# Patient Record
Sex: Female | Born: 2000 | Race: Black or African American | Hispanic: No | Marital: Single | Smoking: Never smoker
Health system: Southern US, Community
[De-identification: ages and names within clinical notes are randomized; demographics above are authoritative.]

## PROBLEM LIST (undated history)

## (undated) DIAGNOSIS — R519 Headache, unspecified: Secondary | ICD-10-CM

## (undated) DIAGNOSIS — F329 Major depressive disorder, single episode, unspecified: Secondary | ICD-10-CM

## (undated) DIAGNOSIS — F411 Generalized anxiety disorder: Secondary | ICD-10-CM

## (undated) DIAGNOSIS — F909 Attention-deficit hyperactivity disorder, unspecified type: Secondary | ICD-10-CM

## (undated) DIAGNOSIS — R51 Headache: Secondary | ICD-10-CM

## (undated) HISTORY — DX: Headache, unspecified: R51.9

## (undated) HISTORY — DX: Headache: R51

## (undated) HISTORY — PX: NO PAST SURGERIES: SHX2092

## (undated) HISTORY — DX: Major depressive disorder, single episode, unspecified: F32.9

## (undated) HISTORY — DX: Generalized anxiety disorder: F41.1

---
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
## 2022-01-19 ENCOUNTER — Encounter (HOSPITAL_COMMUNITY): Payer: Self-pay

## 2022-01-19 ENCOUNTER — Telehealth (HOSPITAL_COMMUNITY): Payer: BLUE CROSS/BLUE SHIELD | Admitting: Psychiatry

## 2022-01-22 ENCOUNTER — Telehealth (HOSPITAL_COMMUNITY): Payer: Self-pay | Admitting: Psychiatry

## 2022-04-26 ENCOUNTER — Other Ambulatory Visit (HOSPITAL_COMMUNITY): Payer: Self-pay | Admitting: Psychiatry

## 2022-04-26 DIAGNOSIS — F331 Major depressive disorder, recurrent, moderate: Secondary | ICD-10-CM

## 2022-04-26 DIAGNOSIS — F411 Generalized anxiety disorder: Secondary | ICD-10-CM

## 2022-05-04 ENCOUNTER — Other Ambulatory Visit (HOSPITAL_COMMUNITY): Payer: Self-pay | Admitting: Psychiatry

## 2022-05-04 DIAGNOSIS — F331 Major depressive disorder, recurrent, moderate: Secondary | ICD-10-CM

## 2022-05-04 DIAGNOSIS — F411 Generalized anxiety disorder: Secondary | ICD-10-CM

## 2022-05-25 ENCOUNTER — Other Ambulatory Visit (HOSPITAL_COMMUNITY): Payer: Self-pay | Admitting: Psychiatry

## 2022-05-25 DIAGNOSIS — F331 Major depressive disorder, recurrent, moderate: Secondary | ICD-10-CM

## 2022-05-25 DIAGNOSIS — F411 Generalized anxiety disorder: Secondary | ICD-10-CM

## 2022-05-25 NOTE — Telephone Encounter (Signed)
Received request for refill of patient's Remeron.  Patient last received a refill on 7/11.  This was sent in.   Sent: -Remeron 15 mg QHS.  30 tablets with 0 refills.    Arna Snipe MD Resident

## 2022-06-23 ENCOUNTER — Encounter (HOSPITAL_COMMUNITY): Payer: Self-pay

## 2022-06-23 ENCOUNTER — Encounter (HOSPITAL_COMMUNITY): Payer: Self-pay | Admitting: Psychiatry

## 2022-06-23 ENCOUNTER — Telehealth (INDEPENDENT_AMBULATORY_CARE_PROVIDER_SITE_OTHER): Payer: No Payment, Other | Admitting: Psychiatry

## 2022-06-23 DIAGNOSIS — F411 Generalized anxiety disorder: Secondary | ICD-10-CM | POA: Diagnosis not present

## 2022-06-23 DIAGNOSIS — F331 Major depressive disorder, recurrent, moderate: Secondary | ICD-10-CM

## 2022-06-23 DIAGNOSIS — F900 Attention-deficit hyperactivity disorder, predominantly inattentive type: Secondary | ICD-10-CM | POA: Insufficient documentation

## 2022-06-23 MED ORDER — MIRTAZAPINE 15 MG PO TABS: 15.0000 mg | ORAL_TABLET | Freq: Every day | ORAL | 0 refills | Status: DC

## 2022-06-23 MED ORDER — MIRTAZAPINE 15 MG PO TABS: 15.0000 mg | ORAL_TABLET | Freq: Every day | ORAL | 3 refills | Status: DC

## 2022-06-23 MED ORDER — VILOXAZINE HCL ER 200 MG PO CP24: 200.0000 mg | ORAL_CAPSULE | Freq: Every day | ORAL | 3 refills | Status: DC

## 2022-06-23 NOTE — Progress Notes (Signed)
BH MD/PA/NP OP Progress Note Virtual Visit via Telephone Note  I connected with Shannon Jimenez on 06/23/22 at  3:00 PM EDT by telephone and verified that I am speaking with the correct person using two identifiers.  Location: Patient: home Provider: Clinic   I discussed the limitations, risks, security and privacy concerns of performing an evaluation and management service by telephone and the availability of in person appointments. I also discussed with the patient that there may be a patient responsible charge related to this service. The patient expressed understanding and agreed to proceed.   I provided 30 minutes of non-face-to-face time during this encounter.   06/23/2022 2:57 PM Shannon Jimenez  MRN:  [id number]  Chief Complaint: "I have some issues concentrating"    HPI: 21 year old female seen today for follow up psychiatric evaluation. She has a psychiatric history of anxiety and depression.  He is currently managed on mirtazapine 15 mg nightly.  She notes her medication is somewhat effective in managing her psychiatric conditions.  Today she was unable to login virtually so assessment is done over the phone.  During exam she was pleasant, cooperative, and engaged in conversation.  She informed Clinical research associate that she loves her medication but notes that she has issues concentrating.  She endorses symptoms of ADHD such as distractibility, poor listening skills, inattentiveness to mentally taxing task, and forgetfulness.  Patient reports that she found Strattera ineffective in the past.  She has also tried Wellbutrin without success.  Provider discussed Leavy Cella with patient.  She informed Clinical research associate that she would like to try it.    Patient informed writer that her anxiety and depression are well managed.  Today provider conducted a GAD-7 and patient scored an 8.  Provider also conducted PHQ-9 and patient scored a 7.  She endorses adequate sleep and appetite today she denies SI/HI/VAH,  mania, paranoia.  Patient informed Clinical research associate that she ran out of her medications 3 weeks ago and experienced hallucinations while off.  Since restarting she notes that she no longer has hallucinations.    Today patient agreeable to starting Qulbree 200 mg daily to help manage symptoms of ADHD. Potential side effects of medication and risks vs benefits of treatment vs non-treatment were explained and discussed. All questions were answered.  She will continue mirtazapine as prescribed.  Visit Diagnosis:    ICD-10-CM   1. Attention deficit hyperactivity disorder (ADHD), predominantly inattentive type  F90.0 viloxazine ER (QELBREE) 200 MG 24 hr capsule    2. Generalized anxiety disorder  F41.1 mirtazapine (REMERON) 15 MG tablet    3. Major depressive disorder, recurrent episode, moderate (HCC)  F33.1 mirtazapine (REMERON) 15 MG tablet      Past Psychiatric History: anxiety and depression  Past Medical History:  Past Medical History:  Diagnosis Date   ADHD (attention deficit hyperactivity disorder)    Headache     Past Surgical History:  Procedure Laterality Date   NO PAST SURGERIES      Family Psychiatric History: Maternal Aunt bipolar disorder, depression, and anxiety, maternal mother depression, and anxiety, mother depression and anxiety, sister depression and anxiety   Family History:  Family History  Problem Relation Age of Onset   Other Paternal Grandfather        Died at the age of 37 due to Flu/H1N1   Migraines Mother    ADD / ADHD Mother    Anxiety disorder Mother    Depression Mother    Anxiety disorder Maternal Grandmother  Depression Maternal Grandmother    Migraines Paternal Grandmother    Migraines Maternal Aunt    Seizures Neg Hx    Autism Neg Hx    Bipolar disorder Neg Hx    Schizophrenia Neg Hx     Social History:  Social History   Socioeconomic History   Marital status: Single    Spouse name: Not on file   Number of children: Not on file   Years of  education: Not on file   Highest education level: Not on file  Occupational History   Not on file  Tobacco Use   Smoking status: Never   Smokeless tobacco: Never  Substance and Sexual Activity   Alcohol use: Not on file   Drug use: Not on file   Sexual activity: Not on file  Other Topics Concern   Not on file  Social History Narrative   Lives with mom, maternal grandparents, three siblings. She is in the 10th grade at [employer].    Social Determinants of Health   Financial Resource Strain: Not on file  Food Insecurity: Not on file  Transportation Needs: Not on file  Physical Activity: Not on file  Stress: Not on file  Social Connections: Not on file    Allergies:  Allergies  Allergen Reactions   Focalin [Dexmethylphenidate Hcl]     Metabolic Disorder Labs: No results found for: "HGBA1C", "MPG" No results found for: "PROLACTIN" No results found for: "CHOL", "TRIG", "HDL", "CHOLHDL", "VLDL", "LDLCALC" No results found for: "TSH"  Therapeutic Level Labs: No results found for: "LITHIUM" No results found for: "VALPROATE" No results found for: "CBMZ"  Current Medications: Current Outpatient Medications  Medication Sig Dispense Refill   viloxazine ER (QELBREE) 200 MG 24 hr capsule Take 1 capsule (200 mg total) by mouth daily. 30 capsule 3   ibuprofen (ADVIL,MOTRIN) 100 MG/5ML suspension Take 100 mg by mouth every 6 (six) hours as needed for pain or fever.     Magnesium Oxide 500 MG TABS Take 1 tablet (500 mg total) by mouth daily.  0   mirtazapine (REMERON) 15 MG tablet Take 1 tablet (15 mg total) by mouth at bedtime. 30 tablet 0   riboflavin (VITAMIN B-2) 100 MG TABS tablet Take 1 tablet (100 mg total) by mouth daily.  0   No current facility-administered medications for this visit.     Musculoskeletal: Strength & Muscle Tone:  Unable to assess due to telephone visit Gait & Station:  Unable to assess due to telephone visit Patient leans:  N/A  Psychiatric Specialty Exam: Review of Systems  There were no vitals taken for this visit.There is no height or weight on file to calculate BMI.  General Appearance:  Unable to assess due to telephone visit  Eye Contact:   Unable to assess due to telephone visit  Speech:  Clear and Coherent and Normal Rate  Volume:  Normal  Mood:  Anxious and Depressed  Affect:  Appropriate and Congruent  Thought Process:  Coherent, Goal Directed and Linear  Orientation:  Full (Time, Place, and Person)  Thought Content: WDL and Logical   Suicidal Thoughts:  No  Homicidal Thoughts:  No  Memory:  Immediate;   Good Recent;   Good Remote;   Good  Judgement:  Good  Insight:  Good  Psychomotor Activity:   Unable to assess due to telephone visit  Concentration:  Concentration: Good and Attention Span: Good  Recall:  Good  Fund of Knowledge: Good  Language: Good  Akathisia:   Able to assess due to telephone visit  Handed:  Right  AIMS (if indicated): Not done  Assets:  Communication Skills Desire for Improvement Financial Resources/Insurance Housing Leisure Time Physical Health Social Support  ADL's:  Intact  Cognition: WNL  Sleep:  Good   Screenings: GAD-7    Flowsheet Row Video Visit from 06/23/2022 in [facility] Video Visit from 11/12/2021 in [facility] Clinical Support from 08/18/2021 in [facility] Clinical Support from 05/12/2021 in [facility] Clinical Support from 02/09/2021 in [facility]  Total GAD-7 Score 8 10 9 13 7       PHQ2-9    Flowsheet Row Video Visit from 06/23/2022 in [facility] Video Visit from 11/12/2021 in [facility] Clinical Support from 08/18/2021 in [facility] Clinical Support from 05/12/2021 in [facility]  Clinical Support from 02/09/2021 in [facility]  PHQ-2 Total Score 1 3 1 1 6   PHQ-9 Total Score 8 16 10 11 14       Flowsheet Row Video Visit from 11/12/2021 in [facility] Clinical Support from 08/18/2021 in [facility] Clinical Support from 02/09/2021 in [facility]  C-SSRS RISK CATEGORY Error: Q7 should not be populated when Q6 is No Error: Q7 should not be populated when Q6 is No No Risk        Assessment and Plan: Patient informed writer that her anxiety and depression are well managed however notes that she is having issues with concentration.  Today she is agreeable to start equilibrate 200 mg to help manage symptoms of ADHD.  She will continue mirtazapine as prescribed.  1. Generalized anxiety disorder  Continue- mirtazapine (REMERON) 15 MG tablet; Take 1 tablet (15 mg total) by mouth at bedtime.  Dispense: 30 tablet; Refill: 3  2. Major depressive disorder, recurrent episode, moderate (HCC)  Continue- mirtazapine (REMERON) 15 MG tablet; Take 1 tablet (15 mg total) by mouth at bedtime.  Dispense: 30 tablet; Refill: 3  3. Attention deficit hyperactivity disorder (ADHD), predominantly inattentive type  Start- viloxazine ER (QELBREE) 200 MG 24 hr capsule; Take 1 capsule (200 mg total) by mouth daily.  Dispense: 30 capsule; Refill: 3    Follow-up in 3 months Follow-up with therapy   [facility], NP 06/23/2022, 2:57 PM

## 2022-07-02 ENCOUNTER — Telehealth (HOSPITAL_COMMUNITY): Payer: Self-pay | Admitting: *Deleted

## 2022-07-02 NOTE — Telephone Encounter (Signed)
Request on Covermymeds for her to have a PA for Sm[place]. Reviewed her chart and I dont see any insurance listed. Without insurance no need to do a PA. Pharmacy not open at this time, will call them later today to see if they have insurance on her I am not aware of and if so will submit an appropriate PA>

## 2022-09-15 ENCOUNTER — Telehealth (HOSPITAL_COMMUNITY): Payer: Self-pay | Admitting: *Deleted

## 2022-09-15 ENCOUNTER — Other Ambulatory Visit (HOSPITAL_COMMUNITY): Payer: Self-pay | Admitting: Psychiatry

## 2022-09-15 DIAGNOSIS — F900 Attention-deficit hyperactivity disorder, predominantly inattentive type: Secondary | ICD-10-CM

## 2022-09-15 MED ORDER — VILOXAZINE HCL ER 200 MG PO CP24: 200.0000 mg | ORAL_CAPSULE | Freq: Every day | ORAL | 3 refills | Status: DC

## 2022-09-15 NOTE — Telephone Encounter (Signed)
Rx [facility] NEED FOR CLARIFICATION  QELBREE ER 200 NG CAP QTY: 30 SIG: TAKE 1 CAPSULE BY MOTH ONCE DAILY

## 2022-09-15 NOTE — Telephone Encounter (Signed)
Medication refilled and sent to preferred pharmacy

## 2022-09-21 ENCOUNTER — Telehealth (HOSPITAL_COMMUNITY): Payer: Self-pay

## 2022-09-23 ENCOUNTER — Telehealth (INDEPENDENT_AMBULATORY_CARE_PROVIDER_SITE_OTHER): Payer: No Payment, Other | Admitting: Psychiatry

## 2022-09-23 DIAGNOSIS — F411 Generalized anxiety disorder: Secondary | ICD-10-CM | POA: Diagnosis not present

## 2022-09-23 DIAGNOSIS — F900 Attention-deficit hyperactivity disorder, predominantly inattentive type: Secondary | ICD-10-CM

## 2022-09-23 DIAGNOSIS — F331 Major depressive disorder, recurrent, moderate: Secondary | ICD-10-CM | POA: Diagnosis not present

## 2022-09-23 MED ORDER — VILOXAZINE HCL ER 200 MG PO CP24: 200.0000 mg | ORAL_CAPSULE | Freq: Every day | ORAL | 3 refills | Status: DC

## 2022-09-23 MED ORDER — MIRTAZAPINE 15 MG PO TABS: 15.0000 mg | ORAL_TABLET | Freq: Every day | ORAL | 3 refills | Status: DC

## 2022-09-23 NOTE — Progress Notes (Signed)
BH MD/PA/NP OP Progress Note Virtual Visit via Video Note  I connected with Shannon Jimenez on 09/24/22 at  2:30 PM EST by a video enabled telemedicine application and verified that I am speaking with the correct person using two identifiers.  Location: Patient: Home Provider: Clinic   I discussed the limitations of evaluation and management by telemedicine and the availability of in person appointments. The patient expressed understanding and agreed to proceed.  I provided 30 minutes of non-face-to-face time during this encounter.     09/24/2022 7:45 AM Shannon Jimenez  MRN:  [id number]  Chief Complaint: "I am not good at saying how I feel"    HPI: 21 year old female seen today for follow up psychiatric evaluation. She has a psychiatric history of anxiety and depression.  He is currently managed on mirtazapine 15 mg nightly and Qulebree 200 mg daily.  She notes her medication are effective in managing her psychiatric conditions.  Today she was well groomed, pleasant, cooperative, engaged in conversation, and maintained eye contact.  She informed Probation officer that she is not good at saying what she feels.  She informed Probation officer that at time she is unsure if she is anxious or depressed.  She notes that she identifies her depressive states by her energy level.  She notes that when she is unable to get out of bed or attend to her ADLs she is depressed.  Today provider conducted PHQ-9 and patient scored a 14, at her last visit she scored an 8, provide also conducted a GAD-7 and patient scored an 8, at her last visit she scored an 8.  She reports that she is worried about finding a job, decision she makes, and her relationships.  Today she endorses passive SI but denies wanting to harm herself.  She denies SI/HI/AVH, mania, paranoia.   Patient informed Probation officer that she has been without Deboraha Sprang for a few weeks and continues to struggle with symptoms of ADHD such as distractibility, poor  inattentiveness to mentally taxing task.  Today she is agreeable to restarting Qelbree 200 mg daily.  She will continue mirtazapine as prescribed.  No other concerns noted at this time.      Visit Diagnosis:    ICD-10-CM   1. Major depressive disorder, recurrent episode, moderate (HCC)  F33.1 mirtazapine (REMERON) 15 MG tablet    2. Generalized anxiety disorder  F41.1 mirtazapine (REMERON) 15 MG tablet    3. Attention deficit hyperactivity disorder (ADHD), predominantly inattentive type  F90.0 viloxazine ER (QELBREE) 200 MG 24 hr capsule      Past Psychiatric History: anxiety and depression  Past Medical History:  Past Medical History:  Diagnosis Date   ADHD (attention deficit hyperactivity disorder)    Headache     Past Surgical History:  Procedure Laterality Date   NO PAST SURGERIES      Family Psychiatric History: Maternal Aunt bipolar disorder, depression, and anxiety, maternal mother depression, and anxiety, mother depression and anxiety, sister depression and anxiety   Family History:  Family History  Problem Relation Age of Onset   Other Paternal Grandfather        Died at the age of 55 due to Flu/H1N1   Migraines Mother    ADD / ADHD Mother    Anxiety disorder Mother    Depression Mother    Anxiety disorder Maternal Grandmother    Depression Maternal Grandmother    Migraines Paternal Grandmother    Migraines Maternal Aunt    Seizures Neg  Hx    Autism Neg Hx    Bipolar disorder Neg Hx    Schizophrenia Neg Hx     Social History:  Social History   Socioeconomic History   Marital status: Single    Spouse name: Not on file   Number of children: Not on file   Years of education: Not on file   Highest education level: Not on file  Occupational History   Not on file  Tobacco Use   Smoking status: Never   Smokeless tobacco: Never  Substance and Sexual Activity   Alcohol use: Not on file   Drug use: Not on file   Sexual activity: Not on file  Other  Topics Concern   Not on file  Social History Narrative   Lives with mom, maternal grandparents, three siblings. She is in the 10th grade at [employer].    Social Determinants of Health   Financial Resource Strain: Not on file  Food Insecurity: Not on file  Transportation Needs: Not on file  Physical Activity: Not on file  Stress: Not on file  Social Connections: Not on file    Allergies:  Allergies  Allergen Reactions   Focalin [Dexmethylphenidate Hcl]     Metabolic Disorder Labs: No results found for: "HGBA1C", "MPG" No results found for: "PROLACTIN" No results found for: "CHOL", "TRIG", "HDL", "CHOLHDL", "VLDL", "LDLCALC" No results found for: "TSH"  Therapeutic Level Labs: No results found for: "LITHIUM" No results found for: "VALPROATE" No results found for: "CBMZ"  Current Medications: Current Outpatient Medications  Medication Sig Dispense Refill   ibuprofen (ADVIL,MOTRIN) 100 MG/5ML suspension Take 100 mg by mouth every 6 (six) hours as needed for pain or fever.     Magnesium Oxide 500 MG TABS Take 1 tablet (500 mg total) by mouth daily.  0   mirtazapine (REMERON) 15 MG tablet Take 1 tablet (15 mg total) by mouth at bedtime. 30 tablet 3   riboflavin (VITAMIN B-2) 100 MG TABS tablet Take 1 tablet (100 mg total) by mouth daily.  0   viloxazine ER (QELBREE) 200 MG 24 hr capsule Take 1 capsule (200 mg total) by mouth daily. 30 capsule 3   No current facility-administered medications for this visit.     Musculoskeletal: Strength & Muscle Tone: within normal limits and  telehealth visit Gait & Station: normal, telehealth visit Patient leans: N/A  Psychiatric Specialty Exam: Review of Systems  There were no vitals taken for this visit.There is no height or weight on file to calculate BMI.  General Appearance: Well Groomed  Eye Contact:  Good  Speech:  Clear and Coherent and Normal Rate  Volume:  Normal  Mood:  Depressed and reports able to cope   Affect:  Appropriate and Congruent  Thought Process:  Coherent, Goal Directed and Linear  Orientation:  Full (Time, Place, and Person)  Thought Content: WDL and Logical   Suicidal Thoughts:  Yes.  without intent/plan  Homicidal Thoughts:  No  Memory:  Immediate;   Good Recent;   Good Remote;   Good  Judgement:  Good  Insight:  Good  Psychomotor Activity:  Normal  Concentration:  Concentration: Good and Attention Span: Good  Recall:  Good  Fund of Knowledge: Good  Language: Good  Akathisia:  No  Handed:  Right  AIMS (if indicated): Not done  Assets:  Communication Skills Desire for Improvement Financial Resources/Insurance Housing Leisure Time Physical Health Social Support  ADL's:  Intact  Cognition: WNL  Sleep:  Good   Screenings: GAD-7    Flowsheet Row Video Visit from 09/23/2022 in [facility] Video Visit from 06/23/2022 in [facility] Video Visit from 11/12/2021 in [place] from 08/18/2021 in [place] from 05/12/2021 in [facility]  Total GAD-7 Score 8 8 10 9 13       PHQ2-9    Flowsheet Row Video Visit from 09/23/2022 in [facility] Video Visit from 06/23/2022 in [facility] Video Visit from 11/12/2021 in [place] from 08/18/2021 in [place] from 05/12/2021 in [place]  PHQ-2 Total Score 3 1 3 1 1   PHQ-9 Total Score 14 8 16 10 11       Flowsheet Row Video Visit from 09/23/2022 in [facility] Video Visit from 11/12/2021 in [place] from 08/18/2021 in [place] Error: Q7 should  not be populated when Q6 is No Error: Q7 should not be populated when Q6 is No Error: Q7 should not be populated when Q6 is No        Assessment and Plan: Patient endorses mild anxiety and depression but reports she is able to cope with it.  Patient informed Probation officer that she has been without Deboraha Sprang for a few weeks and continues to struggle with symptoms of ADHD such as distractibility, poor inattentiveness to mentally taxing task.  Today she is agreeable to restarting Qelbree 200 mg daily.  She will continue mirtazapine as prescribed.  1. Generalized anxiety disorder  Continue- mirtazapine (REMERON) 15 MG tablet; Take 1 tablet (15 mg total) by mouth at bedtime.  Dispense: 30 tablet; Refill: 3  2. Major depressive disorder, recurrent episode, moderate (HCC)  Continue- mirtazapine (REMERON) 15 MG tablet; Take 1 tablet (15 mg total) by mouth at bedtime.  Dispense: 30 tablet; Refill: 3  3. Attention deficit hyperactivity disorder (ADHD), predominantly inattentive type  Retart- viloxazine ER (QELBREE) 200 MG 24 hr capsule; Take 1 capsule (200 mg total) by mouth daily.  Dispense: 30 capsule; Refill: 3    Follow-up in 3 months Follow-up with therapy   Salley Slaughter, NP 09/24/2022, 7:45 AM

## 2022-09-24 ENCOUNTER — Encounter (HOSPITAL_COMMUNITY): Payer: Self-pay | Admitting: Psychiatry

## 2022-12-15 ENCOUNTER — Encounter (HOSPITAL_COMMUNITY): Payer: Self-pay | Admitting: Psychiatry

## 2022-12-15 ENCOUNTER — Telehealth (INDEPENDENT_AMBULATORY_CARE_PROVIDER_SITE_OTHER): Payer: No Payment, Other | Admitting: Psychiatry

## 2022-12-15 DIAGNOSIS — F900 Attention-deficit hyperactivity disorder, predominantly inattentive type: Secondary | ICD-10-CM

## 2022-12-15 DIAGNOSIS — F411 Generalized anxiety disorder: Secondary | ICD-10-CM | POA: Diagnosis not present

## 2022-12-15 DIAGNOSIS — F331 Major depressive disorder, recurrent, moderate: Secondary | ICD-10-CM | POA: Diagnosis not present

## 2022-12-15 MED ORDER — VILOXAZINE HCL ER 200 MG PO CP24: 200.0000 mg | ORAL_CAPSULE | Freq: Every day | ORAL | 3 refills | Status: DC

## 2022-12-15 MED ORDER — MIRTAZAPINE 7.5 MG PO TABS: 7.5000 mg | ORAL_TABLET | Freq: Every day | ORAL | 3 refills | Status: DC

## 2022-12-15 NOTE — Progress Notes (Signed)
BH MD/PA/NP OP Progress Note Virtual Visit via Video Note  I connected with Shannon Jimenez on 12/15/22 at  3:30 PM EST by a video enabled telemedicine application and verified that I am speaking with the correct person using two identifiers.  Location: Patient: Home Provider: Clinic   I discussed the limitations of evaluation and management by telemedicine and the availability of in person appointments. The patient expressed understanding and agreed to proceed.  I provided 30 minutes of non-face-to-face time during this encounter.     12/15/2022 3:50 PM Shannon Jimenez  MRN:  [id number]  Chief Complaint: "I feel like the mirtazapine may be to high"    HPI: 22 year old female seen today for follow up psychiatric evaluation. She has a psychiatric history of anxiety and depression.  She is currently managed on mirtazapine 15 mg nightly and Qulebree 200 mg daily.  She notes her medication are somewhat effective in managing her psychiatric conditions.  Today she was well groomed, pleasant, cooperative, engaged in conversation, and maintained eye contact.  She informed Probation officer that she and some of her family members feel like mirtazapine is too high.  She notes that she is tired most of the day.  She does Artist that she feels that her anxiety and depression are well-managed but notes that she has other life stressors that exacerbate her anxiety and depression.  Patient informed Probation officer that recently she and a friend expressed their feelings for one another.  She notes that she fears being in love and being vulnerable with someone.  She also notes that she regrets not being there for past friend who passed away last 2023-06-13 in the [place].  She informed Probation officer that she wished that she could have done things differently to show him that she cared.  Provider asked patient if she was interested in counseling.  She informed Probation officer that currently she has a female Social worker but does not feel  that he pushes her to be the best version of herself.  She informed Probation officer that she will be looking into switching providers in the near future.  Today provider conducted GAD-7 and patient scored a 13, at her last visit she scored an 8.  Provider also conducted PHQ-9 patient scored a 15, at her last visit she scored a 14.  She endorses decreased appetite noting that she only eats 1000 cal daily.  She denies binge/purging behaviors.  She endorses adequate sleep.  Today she denies SI/HI/VAH mania or paranoia.    Today mirtazapine 15 mg reduced to 7.5 mg nightly to decreased to daytime fatigue and manage anxiety and depression.  Provider informed patient that her decreased calorie intake may contribute to her fatigue.  She endorsed understanding.  Provider recommended BuSpar, or Wellbutrin at next visit if depression or anxiety worsens.  Patient notes that she has tried these medications in the past and may consider restarting it in the future.  At this time she will continue [place] as prescribed noting that she finds it effective in managing her ADHD.  No other concerns at this time.      Visit Diagnosis:    ICD-10-CM   1. Major depressive disorder, recurrent episode, moderate (HCC)  F33.1 mirtazapine (REMERON) 7.5 MG tablet    2. Generalized anxiety disorder  F41.1 mirtazapine (REMERON) 7.5 MG tablet    3. Attention deficit hyperactivity disorder (ADHD), predominantly inattentive type  F90.0 viloxazine ER (QELBREE) 200 MG 24 hr capsule      Past Psychiatric  History: anxiety and depression  Past Medical History:  Past Medical History:  Diagnosis Date   ADHD (attention deficit hyperactivity disorder)    Headache     Past Surgical History:  Procedure Laterality Date   NO PAST SURGERIES      Family Psychiatric History: Maternal Aunt bipolar disorder, depression, and anxiety, maternal mother depression, and anxiety, mother depression and anxiety, sister depression and anxiety   Family  History:  Family History  Problem Relation Age of Onset   Other Paternal Grandfather        Died at the age of 67 due to Flu/H1N1   Migraines Mother    ADD / ADHD Mother    Anxiety disorder Mother    Depression Mother    Anxiety disorder Maternal Grandmother    Depression Maternal Grandmother    Migraines Paternal Grandmother    Migraines Maternal Aunt    Seizures Neg Hx    Autism Neg Hx    Bipolar disorder Neg Hx    Schizophrenia Neg Hx     Social History:  Social History   Socioeconomic History   Marital status: Single    Spouse name: Not on file   Number of children: Not on file   Years of education: Not on file   Highest education level: Not on file  Occupational History   Not on file  Tobacco Use   Smoking status: Never   Smokeless tobacco: Never  Substance and Sexual Activity   Alcohol use: Not on file   Drug use: Not on file   Sexual activity: Not on file  Other Topics Concern   Not on file  Social History Narrative   Lives with mom, maternal grandparents, three siblings. She is in the 10th grade at [employer].    Social Determinants of Health   Financial Resource Strain: Not on file  Food Insecurity: Not on file  Transportation Needs: Not on file  Physical Activity: Not on file  Stress: Not on file  Social Connections: Not on file    Allergies:  Allergies  Allergen Reactions   Focalin [Dexmethylphenidate Hcl]     Metabolic Disorder Labs: No results found for: "HGBA1C", "MPG" No results found for: "PROLACTIN" No results found for: "CHOL", "TRIG", "HDL", "CHOLHDL", "VLDL", "LDLCALC" No results found for: "TSH"  Therapeutic Level Labs: No results found for: "LITHIUM" No results found for: "VALPROATE" No results found for: "CBMZ"  Current Medications: Current Outpatient Medications  Medication Sig Dispense Refill   ibuprofen (ADVIL,MOTRIN) 100 MG/5ML suspension Take 100 mg by mouth every 6 (six) hours as needed for pain or fever.      Magnesium Oxide 500 MG TABS Take 1 tablet (500 mg total) by mouth daily.  0   mirtazapine (REMERON) 7.5 MG tablet Take 1 tablet (7.5 mg total) by mouth at bedtime. 30 tablet 3   riboflavin (VITAMIN B-2) 100 MG TABS tablet Take 1 tablet (100 mg total) by mouth daily.  0   viloxazine ER (QELBREE) 200 MG 24 hr capsule Take 1 capsule (200 mg total) by mouth daily. 30 capsule 3   No current facility-administered medications for this visit.     Musculoskeletal: Strength & Muscle Tone: within normal limits and  telehealth visit Gait & Station: normal, telehealth visit Patient leans: N/A  Psychiatric Specialty Exam: Review of Systems  There were no vitals taken for this visit.There is no height or weight on file to calculate BMI.  General Appearance: Well Groomed  Eye Contact:  Good  Speech:  Clear and Coherent and Normal Rate  Volume:  Normal  Mood:  Euthymic and situational anxiety and depression but can cope  Affect:  Appropriate and Congruent  Thought Process:  Coherent, Goal Directed and Linear  Orientation:  Full (Time, Place, and Person)  Thought Content: WDL and Logical   Suicidal Thoughts:  No  Homicidal Thoughts:  No  Memory:  Immediate;   Good Recent;   Good Remote;   Good  Judgement:  Good  Insight:  Good  Psychomotor Activity:  Normal  Concentration:  Concentration: Good and Attention Span: Good  Recall:  Good  Fund of Knowledge: Good  Language: Good  Akathisia:  No  Handed:  Right  AIMS (if indicated): Not done  Assets:  Communication Skills Desire for Improvement Financial Resources/Insurance Housing Leisure Time Physical Health Social Support  ADL's:  Intact  Cognition: WNL  Sleep:  Good   Screenings: GAD-7    Flowsheet Row Video Visit from 12/15/2022 in [facility] Video Visit from 09/23/2022 in [facility] Video Visit from 06/23/2022 in [facility] Video Visit from  11/12/2021 in [place] from 08/18/2021 in [facility]  Total GAD-7 Score 13 8 8 10 9      $ PHQ2-9    Flowsheet Row Video Visit from 12/15/2022 in [facility] Video Visit from 09/23/2022 in [facility] Video Visit from 06/23/2022 in [facility] Video Visit from 11/12/2021 in [place] from 08/18/2021 in [place]  PHQ-2 Total Score 2 3 1 3 1  $ PHQ-9 Total Score 15 14 8 16 10      $ Flowsheet Row Video Visit from 09/23/2022 in [facility] Video Visit from 11/12/2021 in [place] from 08/18/2021 in [place] Error: Q7 should not be populated when Q6 is No Error: Q7 should not be populated when Q6 is No Error: Q7 should not be populated when Q6 is No        Assessment and Plan: Patient endorses situational anxiety and depression but reports that she is able to cope.  She does Artist that she would like mirtazapine reduced as it causes daytime fatigue.  Provider informed patient that low calorie intake may contribute to her fatigue.  She endorsed understanding.  Today mirtazapine reduce from 15 mg nightly to 7.5 mg.  She will continue all other medications as prescribed.  1. Major depressive disorder, recurrent episode, moderate (HCC)  Reduced- mirtazapine (REMERON) 7.5 MG tablet; Take 1 tablet (7.5 mg total) by mouth at bedtime.  Dispense: 30 tablet; Refill: 3  2. Generalized anxiety disorder  Reduced- mirtazapine (REMERON) 7.5 MG tablet; Take 1 tablet (7.5 mg total) by mouth at bedtime.  Dispense: 30 tablet; Refill: 3  3. Attention deficit hyperactivity disorder (ADHD), predominantly inattentive type  Continue- viloxazine  ER (QELBREE) 200 MG 24 hr capsule; Take 1 capsule (200 mg total) by mouth daily.  Dispense: 30 capsule; Refill: 3  Follow-up in 3 months Follow-up with therapy   Salley Slaughter, NP 12/15/2022, 3:50 PM

## 2023-03-03 ENCOUNTER — Encounter (HOSPITAL_COMMUNITY): Payer: Self-pay | Admitting: Psychiatry

## 2023-03-03 ENCOUNTER — Telehealth (HOSPITAL_COMMUNITY): Payer: No Payment, Other | Admitting: Psychiatry

## 2023-03-03 DIAGNOSIS — F900 Attention-deficit hyperactivity disorder, predominantly inattentive type: Secondary | ICD-10-CM | POA: Diagnosis not present

## 2023-03-03 DIAGNOSIS — F331 Major depressive disorder, recurrent, moderate: Secondary | ICD-10-CM

## 2023-03-03 DIAGNOSIS — F411 Generalized anxiety disorder: Secondary | ICD-10-CM

## 2023-03-03 MED ORDER — MIRTAZAPINE 7.5 MG PO TABS: 7.5000 mg | ORAL_TABLET | Freq: Every day | ORAL | 3 refills | Status: DC

## 2023-03-03 MED ORDER — VILOXAZINE HCL ER 200 MG PO CP24: 200.0000 mg | ORAL_CAPSULE | Freq: Every day | ORAL | 3 refills | Status: DC

## 2023-03-03 NOTE — Progress Notes (Signed)
BH MD/PA/NP OP Progress Note Virtual Visit via Video Note  I connected with Shannon Jimenez on 03/03/23 at  2:30 PM EDT by a video enabled telemedicine application and verified that I am speaking with the correct person using two identifiers.  Location: Patient: Home Provider: Clinic   I discussed the limitations of evaluation and management by telemedicine and the availability of in person appointments. The patient expressed understanding and agreed to proceed.  I provided 30 minutes of non-face-to-face time during this encounter.     03/03/2023 12:12 PM Shannon Jimenez  MRN:  [id number]  Chief Complaint: "My ADHD meds are working well"    HPI: 22 year old female seen today for follow up psychiatric evaluation. She has a psychiatric history of ADHD, anxiety, and depression.  She is currently managed on mirtazapine 7.5 mg nightly and Qulebree 200 mg daily.  She notes her medication are effective in managing her psychiatric conditions.  Today she was well groomed, pleasant, cooperative, engaged in conversation, and maintained eye contact.  She informed Clinical research associate that her ADHD medications work well. She describes her ADHD as a mountain with howling winds. With Leavy Cella she reports that she not longer has howling winds. She reports that she is able to focus and stay on task. Since her last visit she notes that her anxiety and depression has improved.  Today provider conducted GAD-7 and patient scored a 4, at her last visit she scored an 13.  Provider also conducted PHQ-9 patient scored a 13, at her last visit she scored a 15.  She endorses decreased appetite noting that she forgets to eat at times.  She denies binge/purging behaviors.  She endorses adequate sleep (6 hours).  Today she denies SI/HI/VAH mania or paranoia.    Patient informed writer that at times she has nightmares.  She denies it causing interference with her current life.  Patient requested that mirtazapine be increased to  help manage nightmares.  Provider informed patient that mirtazapine generally does not decrease nightmares and notes that when it was at a higher dose she was experiencing daytime fatigue.  She endorsed understanding and requested to stay at that current dose. No medication changes made today.  Patient agreeable to continue medications as prescribed.  No other concerns at this time        Visit Diagnosis:    ICD-10-CM   1. Major depressive disorder, recurrent episode, moderate (HCC)  F33.1 mirtazapine (REMERON) 7.5 MG tablet    2. Generalized anxiety disorder  F41.1 mirtazapine (REMERON) 7.5 MG tablet    3. Attention deficit hyperactivity disorder (ADHD), predominantly inattentive type  F90.0 viloxazine ER (QELBREE) 200 MG 24 hr capsule      Past Psychiatric History: anxiety and depression  Past Medical History:  Past Medical History:  Diagnosis Date   ADHD (attention deficit hyperactivity disorder)    Headache     Past Surgical History:  Procedure Laterality Date   NO PAST SURGERIES      Family Psychiatric History: Maternal Aunt bipolar disorder, depression, and anxiety, maternal mother depression, and anxiety, mother depression and anxiety, sister depression and anxiety   Family History:  Family History  Problem Relation Age of Onset   Other Paternal Grandfather        Died at the age of 63 due to Flu/H1N1   Migraines Mother    ADD / ADHD Mother    Anxiety disorder Mother    Depression Mother    Anxiety disorder Maternal Grandmother  Depression Maternal Grandmother    Migraines Paternal Grandmother    Migraines Maternal Aunt    Seizures Neg Hx    Autism Neg Hx    Bipolar disorder Neg Hx    Schizophrenia Neg Hx     Social History:  Social History   Socioeconomic History   Marital status: Single    Spouse name: Not on file   Number of children: Not on file   Years of education: Not on file   Highest education level: Not on file  Occupational History    Not on file  Tobacco Use   Smoking status: Never   Smokeless tobacco: Never  Substance and Sexual Activity   Alcohol use: Not on file   Drug use: Not on file   Sexual activity: Not on file  Other Topics Concern   Not on file  Social History Narrative   Lives with mom, maternal grandparents, three siblings. She is in the 10th grade at [employer].    Social Determinants of Health   Financial Resource Strain: Not on file  Food Insecurity: Not on file  Transportation Needs: Not on file  Physical Activity: Not on file  Stress: Not on file  Social Connections: Not on file    Allergies:  Allergies  Allergen Reactions   Focalin [Dexmethylphenidate Hcl]     Metabolic Disorder Labs: No results found for: "HGBA1C", "MPG" No results found for: "PROLACTIN" No results found for: "CHOL", "TRIG", "HDL", "CHOLHDL", "VLDL", "LDLCALC" No results found for: "TSH"  Therapeutic Level Labs: No results found for: "LITHIUM" No results found for: "VALPROATE" No results found for: "CBMZ"  Current Medications: Current Outpatient Medications  Medication Sig Dispense Refill   ibuprofen (ADVIL,MOTRIN) 100 MG/5ML suspension Take 100 mg by mouth every 6 (six) hours as needed for pain or fever.     Magnesium Oxide 500 MG TABS Take 1 tablet (500 mg total) by mouth daily.  0   mirtazapine (REMERON) 7.5 MG tablet Take 1 tablet (7.5 mg total) by mouth at bedtime. 30 tablet 3   riboflavin (VITAMIN B-2) 100 MG TABS tablet Take 1 tablet (100 mg total) by mouth daily.  0   viloxazine ER (QELBREE) 200 MG 24 hr capsule Take 1 capsule (200 mg total) by mouth daily. 30 capsule 3   No current facility-administered medications for this visit.     Musculoskeletal: Strength & Muscle Tone: within normal limits and  telehealth visit Gait & Station: normal, telehealth visit Patient leans: N/A  Psychiatric Specialty Exam: Review of Systems  There were no vitals taken for this visit.There is no height  or weight on file to calculate BMI.  General Appearance: Well Groomed  Eye Contact:  Good  Speech:  Clear and Coherent and Normal Rate  Volume:  Normal  Mood:  Euthymic  Affect:  Appropriate and Congruent  Thought Process:  Coherent, Goal Directed and Linear  Orientation:  Full (Time, Place, and Person)  Thought Content: WDL and Logical   Suicidal Thoughts:  No  Homicidal Thoughts:  No  Memory:  Immediate;   Good Recent;   Good Remote;   Good  Judgement:  Good  Insight:  Good  Psychomotor Activity:  Normal  Concentration:  Concentration: Good and Attention Span: Good  Recall:  Good  Fund of Knowledge: Good  Language: Good  Akathisia:  No  Handed:  Right  AIMS (if indicated): Not done  Assets:  Communication Skills Desire for Improvement Financial Resources/Insurance Housing Leisure Time Physical  Health Social Support  ADL's:  Intact  Cognition: WNL  Sleep:  Good   Screenings: GAD-7    Flowsheet Row Video Visit from 03/03/2023 in [facility] Video Visit from 12/15/2022 in [facility] Video Visit from 09/23/2022 in [facility] Video Visit from 06/23/2022 in [facility] Video Visit from 11/12/2021 in [facility]  Total GAD-7 Score 4 13 8 8 10       PHQ2-9    Flowsheet Row Video Visit from 03/03/2023 in [facility] Video Visit from 12/15/2022 in [facility] Video Visit from 09/23/2022 in [facility] Video Visit from 06/23/2022 in [facility] Video Visit from 11/12/2021 in [facility]  PHQ-2 Total Score 2 2 3 1 3   PHQ-9 Total Score 13 15 14 8 16       Flowsheet Row Video Visit from 09/23/2022 in [facility] Video Visit from 11/12/2021 in [facility] Clinical Support from 08/18/2021 in [facility]  C-SSRS RISK CATEGORY Error: Q7 should not be populated when Q6 is No Error: Q7 should not be populated when Q6 is No Error: Q7 should not be populated when Q6 is No        Assessment and Plan: Patient reports that her anxiety and depression has improved since her last visit.  She does note that she has nightmares but reports that it is not interfering with her current life. Patient requested that mirtazapine be increased to help manage nightmares.  Provider informed patient that mirtazapine generally does not decrease nightmares and notes that when it was at a higher dose she was experiencing daytime fatigue.  She endorsed understanding and requested to stay at that current dose. No medication changes made today.  Patient agreeable to continue medications as prescribed.   1. Major depressive disorder, recurrent episode, moderate (HCC)  Continue- mirtazapine (REMERON) 7.5 MG tablet; Take 1 tablet (7.5 mg total) by mouth at bedtime.  Dispense: 30 tablet; Refill: 3  2. Generalized anxiety disorder  Continue- mirtazapine (REMERON) 7.5 MG tablet; Take 1 tablet (7.5 mg total) by mouth at bedtime.  Dispense: 30 tablet; Refill: 3  3. Attention deficit hyperactivity disorder (ADHD), predominantly inattentive type  Continue- viloxazine ER (QELBREE) 200 MG 24 hr capsule; Take 1 capsule (200 mg total) by mouth daily.  Dispense: 30 capsule; Refill: 3  Follow-up in 2.5 months Follow-up with therapy   Shanna Cisco, NP 03/03/2023, 12:12 PM

## 2023-03-16 ENCOUNTER — Ambulatory Visit: Payer: No Typology Code available for payment source | Admitting: Family Medicine

## 2023-03-16 ENCOUNTER — Encounter: Payer: Self-pay | Admitting: Family Medicine

## 2023-03-16 ENCOUNTER — Telehealth: Payer: Self-pay | Admitting: Family Medicine

## 2023-03-16 VITALS — BP 108/80 | HR 87 | Temp 98.5°F | Ht 64.75 in | Wt 169.5 lb

## 2023-03-16 DIAGNOSIS — Z114 Encounter for screening for human immunodeficiency virus [HIV]: Secondary | ICD-10-CM

## 2023-03-16 DIAGNOSIS — Z1159 Encounter for screening for other viral diseases: Secondary | ICD-10-CM

## 2023-03-16 DIAGNOSIS — M25561 Pain in right knee: Secondary | ICD-10-CM | POA: Diagnosis not present

## 2023-03-16 DIAGNOSIS — H6122 Impacted cerumen, left ear: Secondary | ICD-10-CM | POA: Diagnosis not present

## 2023-03-16 DIAGNOSIS — N942 Vaginismus: Secondary | ICD-10-CM | POA: Diagnosis not present

## 2023-03-16 NOTE — Assessment & Plan Note (Signed)
S/p manual removal and also irrigation. Ear is clear of wax at this time. RTC PRN

## 2023-03-16 NOTE — Telephone Encounter (Signed)
Pt said she is interested in Hepatitis C and HIV testing. She would like the nurse to reach out to her with further information.

## 2023-03-16 NOTE — Telephone Encounter (Signed)
Ok! I can place the orders and she can return for a lab appointment to have the blood drawn.

## 2023-03-16 NOTE — Progress Notes (Signed)
New Patient Office Visit  Subjective    Patient ID: Shannon Jimenez, female    DOB: [birth year]  Age: 22 y.o. MRN: [id number]  CC:  Chief Complaint  Patient presents with   Establish Care    HPI Shannon Jimenez presents to establish care Patient is reporting for about 2-3 weeks she was having with a lot of knee pain on both sides. States that when she put pressure on her knees they would be very painful and pop, especially the right knee which was worse than the left. States that she dances for exercise and states she was doing a lot of exercise prior to the injury, states that he was limping and had difficulty straightening out. States that the symptom did resolve, she rested the knee and she used an ACE bandage. States that it will occasionally flare up when she does too much high impact exercise. Knee did not have any redness, was not hot to touch.   Pt reports that her mom wanted her to mention problems with her left ear. States that she gets clogging in the ear with wax. States that she tries to keep it clean, uses mineral oil but she cannot hear out of the left ear.   Pt had questions about vaginal discharge and vaginal pain with insertion of tampon. She reports no fever/chills, she is not sexually active, no vaginal itching or discomfort. She reports normal periods. No pelvic pain, no other associated findings. We  discussed what warning signs would constitute further investigation or referral to GYN. I offered referral due to the vaginal pain but patient states she will wait on this.   Current Outpatient Medications  Medication Instructions   ibuprofen (ADVIL) 100 mg, Oral, As needed   MELATONIN PO Oral, Daily at bedtime   mirtazapine (REMERON) 7.5 mg, Oral, Daily at bedtime   viloxazine ER (QELBREE) 200 mg, Oral, Daily    Past Medical History:  Diagnosis Date   ADHD (attention deficit hyperactivity disorder)    Generalized anxiety disorder    Headache    MDD (major  depressive disorder)     Past Surgical History:  Procedure Laterality Date   NO PAST SURGERIES      Family History  Problem Relation Age of Onset   Migraines Mother    ADD / ADHD Mother    Anxiety disorder Mother    Depression Mother    Depression Maternal Aunt    Migraines Maternal Aunt    Depression Maternal Uncle    Anxiety disorder Maternal Grandmother    Depression Maternal Grandmother    Migraines Paternal Grandmother    Other Paternal Grandfather        Died at the age of 49 due to Flu/H1N1   Seizures Neg Hx    Autism Neg Hx    Bipolar disorder Neg Hx    Schizophrenia Neg Hx     Social History   Socioeconomic History   Marital status: Single    Spouse name: Not on file   Number of children: Not on file   Years of education: Not on file   Highest education level: Not on file  Occupational History   Not on file  Tobacco Use   Smoking status: Never   Smokeless tobacco: Never  Vaping Use   Vaping Use: Never used  Substance and Sexual Activity   Alcohol use: Never   Drug use: Never   Sexual activity: Never  Other Topics Concern   Not on  file  Social History Narrative   Not on file   Social Determinants of Health   Financial Resource Strain: Not on file  Food Insecurity: Not on file  Transportation Needs: Not on file  Physical Activity: Not on file  Stress: Not on file  Social Connections: Not on file  Intimate Partner Violence: Not on file    Review of Systems  All other systems reviewed and are negative.       Objective    BP 108/80 (BP Location: Left Arm, Patient Position: Sitting, Cuff Size: Normal)   Pulse 87   Temp 98.5 F (36.9 C) (Oral)   Ht 5' 4.75" (1.645 m)   Wt 169 lb 8 oz (76.9 kg)   LMP 03/14/2023 (Exact Date)   SpO2 98%   BMI 28.42 kg/m   Physical Exam Vitals reviewed.  Constitutional:      Appearance: Normal appearance. She is well-groomed and normal weight.  HENT:     Right Ear: Tympanic membrane normal.      Left Ear: There is impacted cerumen.  Eyes:     Conjunctiva/sclera: Conjunctivae normal.  Neck:     Thyroid: No thyromegaly.  Cardiovascular:     Rate and Rhythm: Normal rate and regular rhythm.     Pulses: Normal pulses.     Heart sounds: S1 normal and S2 normal.  Pulmonary:     Effort: Pulmonary effort is normal.     Breath sounds: Normal breath sounds and air entry.  Abdominal:     General: Bowel sounds are normal.  Musculoskeletal:        General: No swelling, tenderness or signs of injury.     Right lower leg: No edema.     Left lower leg: No edema.  Neurological:     Mental Status: She is alert and oriented to person, place, and time. Mental status is at baseline.     Gait: Gait is intact.  Psychiatric:        Mood and Affect: Mood and affect normal.        Speech: Speech normal.        Behavior: Behavior normal.        Judgment: Judgment normal.   Cerumen Impaction removal note: After verbal consent was obtained I used a lighted ear currette to remove the majority of the impaction. Upon exam after manual removal there was still wax obstructing the ear canal. Irrigation with a mixture of hydrogen peroxide and water was used to flush the remaining wax. Patient had relief of the hearing difficulty and I examined the ear again and it was clear of wax.       Assessment & Plan:  Acute pain of right knee Assessment & Plan: Likely due to acute knee injury from high impact exercise. Now resolved. Recommended ibuprofen 800 mg every 8 hours as needed for knee pain in the future, also to add icing the knee for any acute injuries    Impacted cerumen of left ear Assessment & Plan: S/p manual removal and also irrigation. Ear is clear of wax at this time. RTC PRN   Vaginismus Assessment & Plan: I offered referral to GYN however pt wants to wait. If she changes her mind she was instructed to call the office and I will place the referral     Return for annual physical exam.    Karie Georges, MD

## 2023-03-16 NOTE — Assessment & Plan Note (Signed)
Likely due to acute knee injury from high impact exercise. Now resolved. Recommended ibuprofen 800 mg every 8 hours as needed for knee pain in the future, also to add icing the knee for any acute injuries

## 2023-03-16 NOTE — Patient Instructions (Addendum)
Ibuprofen -- up to 800 mg every 8 hours as needed for knee pain.   1 capful of hydrogen peroxide  in 1/4 cup of warm water-- pur in ear, let sit for 3-5 minutes, then dump out

## 2023-03-16 NOTE — Assessment & Plan Note (Signed)
I offered referral to GYN however pt wants to wait. If she changes her mind she was instructed to call the office and I will place the referral

## 2023-03-31 ENCOUNTER — Telehealth (HOSPITAL_COMMUNITY): Payer: Self-pay | Admitting: Psychiatry

## 2023-04-01 NOTE — Telephone Encounter (Signed)
Per nursing staff patients prior Shannon Jimenez was completed and approved. Provider attempted to call patient and inform her of this but got her voicemail. Message left regarding approval. Provider informed patient to call clinic with further questions or concerns. Provider also informed patient that she could call and inform her of which pharmacy to send medication to.

## 2023-04-04 ENCOUNTER — Other Ambulatory Visit (HOSPITAL_COMMUNITY): Payer: Self-pay | Admitting: Psychiatry

## 2023-04-04 ENCOUNTER — Telehealth (HOSPITAL_COMMUNITY): Payer: Self-pay | Admitting: Psychiatry

## 2023-04-04 DIAGNOSIS — F900 Attention-deficit hyperactivity disorder, predominantly inattentive type: Secondary | ICD-10-CM

## 2023-04-04 MED ORDER — VILOXAZINE HCL ER 200 MG PO CP24: 200.0000 mg | ORAL_CAPSULE | Freq: Every day | ORAL | 3 refills | Status: DC

## 2023-04-04 NOTE — Telephone Encounter (Signed)
Leavy Cella sent to preferred pharmacy.

## 2023-05-05 ENCOUNTER — Telehealth (HOSPITAL_COMMUNITY): Payer: No Payment, Other | Admitting: Psychiatry

## 2023-05-05 ENCOUNTER — Encounter (HOSPITAL_COMMUNITY): Payer: Self-pay | Admitting: Psychiatry

## 2023-05-05 DIAGNOSIS — F331 Major depressive disorder, recurrent, moderate: Secondary | ICD-10-CM | POA: Diagnosis not present

## 2023-05-05 DIAGNOSIS — F900 Attention-deficit hyperactivity disorder, predominantly inattentive type: Secondary | ICD-10-CM | POA: Diagnosis not present

## 2023-05-05 DIAGNOSIS — F411 Generalized anxiety disorder: Secondary | ICD-10-CM

## 2023-05-05 MED ORDER — VILOXAZINE HCL ER 200 MG PO CP24: 200.0000 mg | ORAL_CAPSULE | Freq: Every day | ORAL | 3 refills | Status: DC

## 2023-05-05 MED ORDER — MIRTAZAPINE 7.5 MG PO TABS: 7.5000 mg | ORAL_TABLET | Freq: Every day | ORAL | 3 refills | Status: DC

## 2023-05-05 NOTE — Progress Notes (Signed)
BH MD/PA/NP OP Progress Note Virtual Visit via Video Note  I connected with Shannon Jimenez on 05/05/23 at  3:00 PM EDT by a video enabled telemedicine application and verified that I am speaking with the correct person using two identifiers.  Location: Patient: Home Provider: Clinic   I discussed the limitations of evaluation and management by telemedicine and the availability of in person appointments. The patient expressed understanding and agreed to proceed.  I provided 30 minutes of non-face-to-face time during this encounter.     05/05/2023 3:25 PM Shannon Jimenez  MRN:  [id number]  Chief Complaint: "My ADHD meds are working well"    HPI: 22 year old female seen today for follow up psychiatric evaluation. She has a psychiatric history of ADHD, anxiety, and depression.  She is currently managed on mirtazapine 7.5 mg nightly and Qulebree 200 mg daily.  She notes her medication are effective in managing her psychiatric conditions.  Today she was well groomed, pleasant, cooperative, engaged in conversation, and maintained eye contact.  She informed Clinical research associate that she went to [place] with her siblings and enjoyed herself. She notes that she enjoyed spending time with her family but felt guilty as she had limited money. She reports that she paid her family members back in other ways. Patient notes that she feared she would have an anxiety attack as she had some on other family trips ([place]). She notes that she did not have a panic attack and reports that she has seen how she has changed as a person and is growing. While on vacation she reports she was without her ADHD medications. She now notes that she has been back on it and reports that it working well well.   Since her last visit she notes that her anxiety and depression has improved.  Today provider conducted GAD-7 and patient scored a 3, at her last visit she scored an 4.  Provider also conducted PHQ-9 patient  scored a 6, at her last visit she scored a 13.  She endorses decreased appetite noting that she abides by a mediterranean diet.  She denies binge/purging behaviors and reports that she has been exercising more. She endorses adequate sleep.  Today she denies SI/HI/VAH mania or paranoia.    No medication changes made today.  Patient agreeable to continue medications as prescribed.  No other concerns at this time        Visit Diagnosis:    ICD-10-CM   1. Generalized anxiety disorder  F41.1 mirtazapine (REMERON) 7.5 MG tablet    2. Attention deficit hyperactivity disorder (ADHD), predominantly inattentive type  F90.0 viloxazine ER (QELBREE) 200 MG 24 hr capsule    3. Major depressive disorder, recurrent episode, moderate (HCC)  F33.1 mirtazapine (REMERON) 7.5 MG tablet       Past Psychiatric History: anxiety and depression  Past Medical History:  Past Medical History:  Diagnosis Date   ADHD (attention deficit hyperactivity disorder)    Generalized anxiety disorder    Headache    MDD (major depressive disorder)     Past Surgical History:  Procedure Laterality Date   NO PAST SURGERIES      Family Psychiatric History: Maternal Aunt bipolar disorder, depression, and anxiety, maternal mother depression, and anxiety, mother depression and anxiety, sister depression and anxiety   Family History:  Family History  Problem Relation Age of Onset   Migraines Mother    ADD / ADHD Mother    Anxiety disorder Mother  Depression Mother    Depression Maternal Aunt    Migraines Maternal Aunt    Depression Maternal Uncle    Anxiety disorder Maternal Grandmother    Depression Maternal Grandmother    Migraines Paternal Grandmother    Other Paternal Grandfather        Died at the age of 19 due to Flu/H1N1   Seizures Neg Hx    Autism Neg Hx    Bipolar disorder Neg Hx    Schizophrenia Neg Hx     Social History:  Social History   Socioeconomic History   Marital status: Single     Spouse name: Not on file   Number of children: Not on file   Years of education: Not on file   Highest education level: Not on file  Occupational History   Not on file  Tobacco Use   Smoking status: Never   Smokeless tobacco: Never  Vaping Use   Vaping status: Never Used  Substance and Sexual Activity   Alcohol use: Never   Drug use: Never   Sexual activity: Never  Other Topics Concern   Not on file  Social History Narrative   Not on file   Social Determinants of Health   Financial Resource Strain: Not on file  Food Insecurity: Not on file  Transportation Needs: Not on file  Physical Activity: Not on file  Stress: Not on file  Social Connections: Not on file    Allergies:  Allergies  Allergen Reactions   Daytrana [Methylphenidate]     Reaction unknown at age 62   Focalin [Dexmethylphenidate Hcl]    Other     Mahi Mahi causes burning sensation in mouth   Strattera [Atomoxetine] Rash    migraines   Wellbutrin [Bupropion] Rash    Metabolic Disorder Labs: No results found for: "HGBA1C", "MPG" No results found for: "PROLACTIN" No results found for: "CHOL", "TRIG", "HDL", "CHOLHDL", "VLDL", "LDLCALC" No results found for: "TSH"  Therapeutic Level Labs: No results found for: "LITHIUM" No results found for: "VALPROATE" No results found for: "CBMZ"  Current Medications: Current Outpatient Medications  Medication Sig Dispense Refill   ibuprofen (ADVIL) 100 MG tablet Take 100 mg by mouth as needed for fever.     MELATONIN PO Take by mouth at bedtime.     mirtazapine (REMERON) 7.5 MG tablet Take 1 tablet (7.5 mg total) by mouth at bedtime. 30 tablet 3   viloxazine ER (QELBREE) 200 MG 24 hr capsule Take 1 capsule (200 mg total) by mouth daily. 30 capsule 3   No current facility-administered medications for this visit.     Musculoskeletal: Strength & Muscle Tone: within normal limits and  telehealth visit Gait & Station: normal, telehealth visit Patient leans:  N/A  Psychiatric Specialty Exam: Review of Systems  There were no vitals taken for this visit.There is no height or weight on file to calculate BMI.  General Appearance: Well Groomed  Eye Contact:  Good  Speech:  Clear and Coherent and Normal Rate  Volume:  Normal  Mood:  Euthymic  Affect:  Appropriate and Congruent  Thought Process:  Coherent, Goal Directed and Linear  Orientation:  Full (Time, Place, and Person)  Thought Content: WDL and Logical   Suicidal Thoughts:  No  Homicidal Thoughts:  No  Memory:  Immediate;   Good Recent;   Good Remote;   Good  Judgement:  Good  Insight:  Good  Psychomotor Activity:  Normal  Concentration:  Concentration: Good and Attention Span:  Good  Recall:  Good  Fund of Knowledge: Good  Language: Good  Akathisia:  No  Handed:  Right  AIMS (if indicated): Not done  Assets:  Communication Skills Desire for Improvement Financial Resources/Insurance Housing Leisure Time Physical Health Social Support  ADL's:  Intact  Cognition: WNL  Sleep:  Good   Screenings: GAD-7    Flowsheet Row [facility] Video Visit from 03/03/2023 in [facility] Video Visit from 12/15/2022 in [facility] Video Visit from 09/23/2022 in [facility] Video Visit from 06/23/2022 in [facility]  Total GAD-7 Score 3 4 13 8 8       PHQ2-9    Flowsheet Row Video Visit from 05/05/2023 in [facility] Office Visit from 03/16/2023 in [facility] Video Visit from 03/03/2023 in [facility] Video Visit from 12/15/2022 in [facility] Video Visit from 09/23/2022 in [facility]  PHQ-2 Total Score 0 0 2 2 3   PHQ-9 Total Score 6 6 13 15 14       Flowsheet Row Video Visit  from 09/23/2022 in [facility] Video Visit from 11/12/2021 in [place] Clinical Support from 08/18/2021 in [facility]  C-SSRS RISK CATEGORY Error: Q7 should not be populated when Q6 is No Error: Q7 should not be populated when Q6 is No Error: Q7 should not be populated when Q6 is No        Assessment and Plan: Patient reports that she is doing well on her current medication regimen.  No medication changes made today.  Patient agreeable to continue medications as prescribed.   1. Major depressive disorder, recurrent episode, moderate (HCC)  Continue- mirtazapine (REMERON) 7.5 MG tablet; Take 1 tablet (7.5 mg total) by mouth at bedtime.  Dispense: 30 tablet; Refill: 3  2. Generalized anxiety disorder  Continue- mirtazapine (REMERON) 7.5 MG tablet; Take 1 tablet (7.5 mg total) by mouth at bedtime.  Dispense: 30 tablet; Refill: 3  3. Attention deficit hyperactivity disorder (ADHD), predominantly inattentive type  Continue- viloxazine ER (QELBREE) 200 MG 24 hr capsule; Take 1 capsule (200 mg total) by mouth daily.  Dispense: 30 capsule; Refill: 3  Follow-up in 2.5 months Follow-up with therapy   Shanna Cisco, NP 05/05/2023, 3:25 PM

## 2023-05-24 NOTE — Telephone Encounter (Signed)
Open in error

## 2023-07-19 ENCOUNTER — Encounter (HOSPITAL_COMMUNITY): Payer: Self-pay | Admitting: Psychiatry

## 2023-07-19 ENCOUNTER — Telehealth (INDEPENDENT_AMBULATORY_CARE_PROVIDER_SITE_OTHER): Payer: No Payment, Other | Admitting: Psychiatry

## 2023-07-19 DIAGNOSIS — F331 Major depressive disorder, recurrent, moderate: Secondary | ICD-10-CM | POA: Diagnosis not present

## 2023-07-19 DIAGNOSIS — F900 Attention-deficit hyperactivity disorder, predominantly inattentive type: Secondary | ICD-10-CM | POA: Diagnosis not present

## 2023-07-19 DIAGNOSIS — F411 Generalized anxiety disorder: Secondary | ICD-10-CM

## 2023-07-19 MED ORDER — MIRTAZAPINE 7.5 MG PO TABS: 7.5000 mg | ORAL_TABLET | Freq: Every day | ORAL | 3 refills | Status: DC

## 2023-07-19 MED ORDER — VILOXAZINE HCL ER 200 MG PO CP24: 200.0000 mg | ORAL_CAPSULE | Freq: Every day | ORAL | 3 refills | Status: DC

## 2023-07-19 NOTE — Progress Notes (Signed)
BH MD/PA/NP OP Progress Note Virtual Visit via Video Note  I connected with Shannon Jimenez on 07/19/23 at  3:30 PM EDT by a video enabled telemedicine application and verified that I am speaking with the correct person using two identifiers.  Location: Patient: Home Provider: Clinic   I discussed the limitations of evaluation and management by telemedicine and the availability of in person appointments. The patient expressed understanding and agreed to proceed.  I provided 30 minutes of non-face-to-face time during this encounter.     07/19/2023 3:35 PM Shannon Jimenez  MRN:  [id number]  Chief Complaint: "I have been enjoying life"    HPI: 22 year old female seen today for follow up psychiatric evaluation. She has a psychiatric history of ADHD, anxiety, and depression.  She is currently managed on mirtazapine 7.5 mg nightly and Qulebree 200 mg daily.  She takes over-the-counter melatonin.  She notes her medication are effective in managing her psychiatric conditions.  Today she was well groomed, pleasant, cooperative, engaged in conversation, and maintained eye contact.  She informed Clinical research associate that she has been enjoying life.  She notes that she has some life stressors but is able to cope with it.  She informed Clinical research associate that recently she has been working as a Nature conservation officer.  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  Overall she notes her mood is stable and notes that she has minimal anxiety and depression.  Today provider conducted a GAD-7 and patient scored a 7, at her last visit she scored a 6.  Provider also conducted PHQ-9 and patient 2, at her last visit she scored a 3.  She notes that her appetite is poor as she forgets to eat.  Today she denies weight loss/gain.  She denies SI/HI/AVH, mania, paranoia.   No medication changes made today.  Patient agreeable to continue medications as prescribed.  No other concerns at this  time        Visit Diagnosis:    ICD-10-CM   1. Generalized anxiety disorder  F41.1 mirtazapine (REMERON) 7.5 MG tablet    2. Major depressive disorder, recurrent episode, moderate (HCC)  F33.1 mirtazapine (REMERON) 7.5 MG tablet    3. Attention deficit hyperactivity disorder (ADHD), predominantly inattentive type  F90.0 viloxazine ER (QELBREE) 200 MG 24 hr capsule        Past Psychiatric History: anxiety and depression  Past Medical History:  Past Medical History:  Diagnosis Date   ADHD (attention deficit hyperactivity disorder)    Generalized anxiety disorder    Headache    MDD (major depressive disorder)     Past Surgical History:  Procedure Laterality Date   NO PAST SURGERIES      Family Psychiatric History: Maternal Aunt bipolar disorder, depression, and anxiety, maternal mother depression, and anxiety, mother depression and anxiety, sister depression and anxiety   Family History:  Family History  Problem Relation Age of Onset   Migraines Mother    ADD / ADHD Mother    Anxiety disorder Mother    Depression Mother    Depression Maternal Aunt    Migraines Maternal Aunt    Depression Maternal Uncle    Anxiety disorder Maternal Grandmother    Depression Maternal Grandmother    Migraines Paternal Grandmother    Other Paternal Grandfather        Died at the age of 78 due to Flu/H1N1   Seizures Neg Hx    Autism Neg Hx  Bipolar disorder Neg Hx    Schizophrenia Neg Hx     Social History:  Social History   Socioeconomic History   Marital status: Single    Spouse name: Not on file   Number of children: Not on file   Years of education: Not on file   Highest education level: Not on file  Occupational History   Not on file  Tobacco Use   Smoking status: Never   Smokeless tobacco: Never  Vaping Use   Vaping status: Never Used  Substance and Sexual Activity   Alcohol use: Never   Drug use: Never   Sexual activity: Never  Other Topics Concern   Not  on file  Social History Narrative   Not on file   Social Determinants of Health   Financial Resource Strain: Not on file  Food Insecurity: Not on file  Transportation Needs: Not on file  Physical Activity: Not on file  Stress: Not on file  Social Connections: Not on file    Allergies:  Allergies  Allergen Reactions   Daytrana [Methylphenidate]     Reaction unknown at age 30   Focalin [Dexmethylphenidate Hcl]    Other     Mahi Mahi causes burning sensation in mouth   Strattera [Atomoxetine] Rash    migraines   Wellbutrin [Bupropion] Rash    Metabolic Disorder Labs: No results found for: "HGBA1C", "MPG" No results found for: "PROLACTIN" No results found for: "CHOL", "TRIG", "HDL", "CHOLHDL", "VLDL", "LDLCALC" No results found for: "TSH"  Therapeutic Level Labs: No results found for: "LITHIUM" No results found for: "VALPROATE" No results found for: "CBMZ"  Current Medications: Current Outpatient Medications  Medication Sig Dispense Refill   ibuprofen (ADVIL) 100 MG tablet Take 100 mg by mouth as needed for fever.     MELATONIN PO Take by mouth at bedtime.     mirtazapine (REMERON) 7.5 MG tablet Take 1 tablet (7.5 mg total) by mouth at bedtime. 30 tablet 3   viloxazine ER (QELBREE) 200 MG 24 hr capsule Take 1 capsule (200 mg total) by mouth daily. 30 capsule 3   No current facility-administered medications for this visit.     Musculoskeletal: Strength & Muscle Tone: within normal limits and  telehealth visit Gait & Station: normal, telehealth visit Patient leans: N/A  Psychiatric Specialty Exam: Review of Systems  There were no vitals taken for this visit.There is no height or weight on file to calculate BMI.  General Appearance: Well Groomed  Eye Contact:  Good  Speech:  Clear and Coherent and Normal Rate  Volume:  Normal  Mood:  Euthymic  Affect:  Appropriate and Congruent  Thought Process:  Coherent, Goal Directed and Linear  Orientation:  Full (Time,  Place, and Person)  Thought Content: WDL and Logical   Suicidal Thoughts:  No  Homicidal Thoughts:  No  Memory:  Immediate;   Good Recent;   Good Remote;   Good  Judgement:  Good  Insight:  Good  Psychomotor Activity:  Normal  Concentration:  Concentration: Good and Attention Span: Good  Recall:  Good  Fund of Knowledge: Good  Language: Good  Akathisia:  No  Handed:  Right  AIMS (if indicated): Not done  Assets:  Communication Skills Desire for Improvement Financial Resources/Insurance Housing Leisure Time Physical Health Social Support  ADL's:  Intact  Cognition: WNL  Sleep:  Good   Screenings: GAD-7    Flowsheet Row Video Visit from 07/19/2023 in [facility] Video  Visit from 05/05/2023 in [facility] Video Visit from 03/03/2023 in [facility] Video Visit from 12/15/2022 in [facility] Video Visit from 09/23/2022 in [facility]  Total GAD-7 Score 2 3 4 13 8       PHQ2-9    Flowsheet Row Video Visit from 07/19/2023 in [facility] Video Visit from 05/05/2023 in [facility] Office Visit from 03/16/2023 in [facility] [place] Video Visit from 03/03/2023 in [facility] Video Visit from 12/15/2022 in [facility]  PHQ-2 Total Score 0 0 0 2 2  PHQ-9 Total Score 7 6 6 13 15       Flowsheet Row Video Visit from 09/23/2022 in [facility] Video Visit from 11/12/2021 in [facility] Clinical Support from 08/18/2021 in [facility]  C-SSRS RISK CATEGORY Error: Q7 should not be populated when Q6 is No Error: Q7 should not be populated when Q6 is No Error: Q7 should not be populated when Q6 is No        Assessment and Plan:  Patient reports that she is doing well on her current medication regimen.  No medication changes made today.  Patient agreeable to continue medications as prescribed.   1. Major depressive disorder, recurrent episode, moderate (HCC)  Continue- mirtazapine (REMERON) 7.5 MG tablet; Take 1 tablet (7.5 mg total) by mouth at bedtime.  Dispense: 30 tablet; Refill: 3  2. Generalized anxiety disorder  Continue- mirtazapine (REMERON) 7.5 MG tablet; Take 1 tablet (7.5 mg total) by mouth at bedtime.  Dispense: 30 tablet; Refill: 3  3. Attention deficit hyperactivity disorder (ADHD), predominantly inattentive type  Continue- viloxazine ER (QELBREE) 200 MG 24 hr capsule; Take 1 capsule (200 mg total) by mouth daily.  Dispense: 30 capsule; Refill: 3  Follow-up in 2.5 months Follow-up with therapy   Shanna Cisco, NP 07/19/2023, 3:35 PM

## 2023-08-04 ENCOUNTER — Telehealth (HOSPITAL_COMMUNITY): Payer: No Payment, Other | Admitting: Psychiatry

## 2023-10-11 ENCOUNTER — Encounter (HOSPITAL_COMMUNITY): Payer: Self-pay | Admitting: Psychiatry

## 2023-10-11 ENCOUNTER — Telehealth (HOSPITAL_COMMUNITY): Payer: No Payment, Other | Admitting: Psychiatry

## 2023-10-11 DIAGNOSIS — F331 Major depressive disorder, recurrent, moderate: Secondary | ICD-10-CM | POA: Diagnosis not present

## 2023-10-11 DIAGNOSIS — F411 Generalized anxiety disorder: Secondary | ICD-10-CM | POA: Diagnosis not present

## 2023-10-11 DIAGNOSIS — F900 Attention-deficit hyperactivity disorder, predominantly inattentive type: Secondary | ICD-10-CM

## 2023-10-11 MED ORDER — VILOXAZINE HCL ER 200 MG PO CP24
200.0000 mg | ORAL_CAPSULE | Freq: Every day | ORAL | 3 refills | Status: DC
Start: 2023-10-11 — End: 2023-11-25

## 2023-10-11 MED ORDER — MIRTAZAPINE 7.5 MG PO TABS: 7.5000 mg | ORAL_TABLET | Freq: Every day | ORAL | 3 refills | Status: DC

## 2023-10-11 NOTE — Progress Notes (Signed)
BH MD/PA/NP OP Progress Note Virtual Visit via Video Note  I connected with Shannon Jimenez on 10/11/23 at  1:30 PM EST by a video enabled telemedicine application and verified that I am speaking with the correct person using two identifiers.  Location: Patient: Home Provider: Clinic   I discussed the limitations of evaluation and management by telemedicine and the availability of in person appointments. The patient expressed understanding and agreed to proceed.  I provided 30 minutes of non-face-to-face time during this encounter.     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 She has a psychiatric history of ADHD, anxiety, and depression.  She is currently managed on mirtazapine 7.5 mg nightly and Qulebree 200 mg daily.  She takes over-the-counter melatonin.  She notes her medication are effective in managing her psychiatric conditions.  Today she was well groomed, pleasant, cooperative, engaged in conversation, and maintained eye contact.  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 She notes that she has been offered the position but notes that she has to first get her licence.  Patient currently mood is stable and notes that she has minimal anxiety and depression.  Today provider conducted a GAD-7 and patient scored a 7, at her last visit she scored a 7.  Provider also conducted PHQ-9 and patient 5, at her last visit she scored a 2.  She notes that her appetite is poor as she forgets to eat.  Today she denies weight loss/gain.  She denies SI/HI/AVH, mania, paranoia.   Patient informed Clinical research associate that she has a fear of being alone.  She notes that she is codependence of her loved ones.  Patient notes that when she thinks about being alone for and extended period reporting that she  has increased anxiety.  Provider encouraged patient to find ways not to be alone.  She reports that she is working on getting a car so she does not isolate in her home and she walks to a group home near her home so that she can socialize with others.  She also reports that she does positive affirmations to help encourage her self.  No medication changes made today.  Patient agreeable to continue medications as prescribed.  No other concerns at this time        Visit Diagnosis:    ICD-10-CM   1. Attention deficit hyperactivity disorder (ADHD), predominantly inattentive type  F90.0 viloxazine ER (QELBREE) 200 MG 24 hr capsule    2. Generalized anxiety disorder  F41.1 mirtazapine (REMERON) 7.5 MG tablet    3. Major depressive disorder, recurrent episode, moderate (HCC)  F33.1 mirtazapine (REMERON) 7.5 MG tablet         Past Psychiatric History: anxiety and depression  Past Medical History:  Past Medical History:  Diagnosis Date   ADHD (attention deficit hyperactivity disorder)    Generalized anxiety disorder    Headache    MDD (major depressive disorder)     Past Surgical History:  Procedure Laterality Date   NO PAST SURGERIES      Family Psychiatric History: Maternal Aunt bipolar disorder, depression, and anxiety, maternal mother depression, and anxiety, mother depression and anxiety, sister depression and anxiety   Family History:  Family History  Problem Relation Age of Onset  Migraines Mother    ADD / ADHD Mother    Anxiety disorder Mother    Depression Mother    Depression Maternal Aunt    Migraines Maternal Aunt    Depression Maternal Uncle    Anxiety disorder Maternal Grandmother    Depression Maternal Grandmother    Migraines Paternal Grandmother    Other Paternal Grandfather        Died at the age of 65 due to Flu/H1N1   Seizures Neg Hx    Autism Neg Hx    Bipolar disorder Neg Hx    Schizophrenia Neg Hx     Social History:  Social History    Socioeconomic History   Marital status: Single    Spouse name: Not on file   Number of children: Not on file   Years of education: Not on file   Highest education level: Not on file  Occupational History   Not on file  Tobacco Use   Smoking status: Never   Smokeless tobacco: Never  Vaping Use   Vaping status: Never Used  Substance and Sexual Activity   Alcohol use: Never   Drug use: Never   Sexual activity: Never  Other Topics Concern   Not on file  Social History Narrative   Not on file   Social Drivers of Health   Financial Resource Strain: Not on file  Food Insecurity: Not on file  Transportation Needs: Not on file  Physical Activity: Not on file  Stress: Not on file  Social Connections: Not on file    Allergies:  Allergies  Allergen Reactions   Daytrana [Methylphenidate]     Reaction unknown at age 75   Focalin [Dexmethylphenidate Hcl]    Other     Mahi Mahi causes burning sensation in mouth   Strattera [Atomoxetine] Rash    migraines   Wellbutrin [Bupropion] Rash    Metabolic Disorder Labs: No results found for: "HGBA1C", "MPG" No results found for: "PROLACTIN" No results found for: "CHOL", "TRIG", "HDL", "CHOLHDL", "VLDL", "LDLCALC" No results found for: "TSH"  Therapeutic Level Labs: No results found for: "LITHIUM" No results found for: "VALPROATE" No results found for: "CBMZ"  Current Medications: Current Outpatient Medications  Medication Sig Dispense Refill   ibuprofen (ADVIL) 100 MG tablet Take 100 mg by mouth as needed for fever.     MELATONIN PO Take by mouth at bedtime.     mirtazapine (REMERON) 7.5 MG tablet Take 1 tablet (7.5 mg total) by mouth at bedtime. 30 tablet 3   viloxazine ER (QELBREE) 200 MG 24 hr capsule Take 1 capsule (200 mg total) by mouth daily. 30 capsule 3   No current facility-administered medications for this visit.     Musculoskeletal: Strength & Muscle Tone: within normal limits and  telehealth visit Gait &  Station: normal, telehealth visit Patient leans: N/A  Psychiatric Specialty Exam: Review of Systems  There were no vitals taken for this visit.There is no height or weight on file to calculate BMI.  General Appearance: Well Groomed  Eye Contact:  Good  Speech:  Clear and Coherent and Normal Rate  Volume:  Normal  Mood:  Euthymic  Affect:  Appropriate and Congruent  Thought Process:  Coherent, Goal Directed and Linear  Orientation:  Full (Time, Place, and Person)  Thought Content: WDL and Logical   Suicidal Thoughts:  No  Homicidal Thoughts:  No  Memory:  Immediate;   Good Recent;   Good Remote;   Good  Judgement:  Good  Insight:  Good  Psychomotor Activity:  Normal  Concentration:  Concentration: Good and Attention Span: Good  Recall:  Good  Fund of Knowledge: Good  Language: Good  Akathisia:  No  Handed:  Right  AIMS (if indicated): Not done  Assets:  Communication Skills Desire for Improvement Financial Resources/Insurance Housing Leisure Time Physical Health Social Support  ADL's:  Intact  Cognition: WNL  Sleep:  Good   Screenings: GAD-7    Flowsheet Row Video Visit from 10/11/2023 in [facility] Video Visit from 07/19/2023 in [facility] Video Visit from 05/05/2023 in [facility] Video Visit from 03/03/2023 in [facility] Video Visit from 12/15/2022 in [facility]  Total GAD-7 Score 7 2 3 4 13       PHQ2-9    Flowsheet Row Video Visit from 10/11/2023 in [facility] Video Visit from 07/19/2023 in [facility] Video Visit from 05/05/2023 in [facility] Office Visit from 03/16/2023 in [facility] Video Visit from 03/03/2023 in [facility]  PHQ-2 Total Score 2 0 0 0 2  PHQ-9 Total Score  5 7 6 6 13       Flowsheet Row Video Visit from 09/23/2022 in [facility] Video Visit from 11/12/2021 in [facility] Clinical Support from 08/18/2021 in [facility]  C-SSRS RISK CATEGORY Error: Q7 should not be populated when Q6 is No Error: Q7 should not be populated when Q6 is No Error: Q7 should not be populated when Q6 is No        Assessment and Plan: Patient reports that at times she fears being alone.  She notes that she feels that she is codependent on her loved one to not be alone.  She does find her medications effective.  At this time no medication changes made today.  Patient agreed to the medication and prescribed.  Provider encouraged patient to find ways to not isolate.  She notes that she is working on getting her license that she can socialize.  She also walks to a group home to volunteer to socialize.  Patient also uses positive affirmations to help her self cope.  1. Major depressive disorder, recurrent episode, moderate (HCC)  Continue- mirtazapine (REMERON) 7.5 MG tablet; Take 1 tablet (7.5 mg total) by mouth at bedtime.  Dispense: 30 tablet; Refill: 3  2. Generalized anxiety disorder  Continue- mirtazapine (REMERON) 7.5 MG tablet; Take 1 tablet (7.5 mg total) by mouth at bedtime.  Dispense: 30 tablet; Refill: 3  3. Attention deficit hyperactivity disorder (ADHD), predominantly inattentive type  Continue- viloxazine ER (QELBREE) 200 MG 24 hr capsule; Take 1 capsule (200 mg total) by mouth daily.  Dispense: 30 capsule; Refill: 3  Follow-up in 2.5 months Follow-up with therapy   Shanna Cisco, NP 10/11/2023, 2:24 PM

## 2023-11-09 ENCOUNTER — Other Ambulatory Visit (HOSPITAL_COMMUNITY): Payer: Self-pay | Admitting: *Deleted

## 2023-11-09 ENCOUNTER — Telehealth (HOSPITAL_COMMUNITY): Payer: Self-pay | Admitting: *Deleted

## 2023-11-09 NOTE — Telephone Encounter (Signed)
 Fax received for prior authorization of Qelbree. Called insurance and was told to fax clinicals to [phone]. Submitted clinicals awaiting decision.

## 2023-11-17 ENCOUNTER — Telehealth (HOSPITAL_COMMUNITY): Payer: Self-pay | Admitting: Psychiatry

## 2023-11-17 ENCOUNTER — Encounter (HOSPITAL_COMMUNITY): Payer: Self-pay | Admitting: Psychiatry

## 2023-11-18 NOTE — Telephone Encounter (Signed)
Appeal placed to [employer] regarding Ms. Pomales prior authorization denial of Leavy Cella. Faxed sent to [phone].

## 2023-11-22 ENCOUNTER — Telehealth (HOSPITAL_COMMUNITY): Payer: Self-pay

## 2023-11-22 NOTE — Telephone Encounter (Signed)
Provider called patient and was informed that she is not aware of who may have called. Provider informed patient that none of her information was disclosed. She did note that she is running out of [place] and was wondering if her prior authorization was approved. Provider informed patient that the clinic was still awaiting approval. She endorsed understanding and agreed. Patient informed that she could pick up a sample of Qulbree until her prior Berkley Harvey is approved. She endorsed understanding and agreed. No other concerns noted at this time.

## 2023-11-22 NOTE — Telephone Encounter (Signed)
Someone called on pt behalf stating they needed a one on one peer support meeting for pt . I did not disclose any information pertaining to the pt the woman on the phone left a number and case number for her contact .

## 2023-11-24 ENCOUNTER — Telehealth (HOSPITAL_COMMUNITY): Payer: Self-pay

## 2023-11-24 NOTE — Telephone Encounter (Signed)
Medication management - Fax received from patient's [employer] with denial of her requested appeals for coverage of Qelbree Cap 200 mg ER, Case # [id number]. Notice reports pt does not met clinical requirements for the medication. Decision stated medication was denied because patient did not try and fail a minimum of 30 day supply of the approved medicine, amphetamine class stimulant (generic amphetamine-dextroamphetamine).  Notes do not show why patient cannot take other stimulant medications.  See scanned decision received this date.

## 2023-11-25 ENCOUNTER — Other Ambulatory Visit (HOSPITAL_COMMUNITY): Payer: Self-pay | Admitting: Psychiatry

## 2023-11-25 DIAGNOSIS — F900 Attention-deficit hyperactivity disorder, predominantly inattentive type: Secondary | ICD-10-CM

## 2023-11-25 MED ORDER — VILOXAZINE HCL ER 200 MG PO CP24: 200.0000 mg | ORAL_CAPSULE | Freq: Every day | ORAL | 3 refills | Status: DC

## 2023-11-25 NOTE — Telephone Encounter (Signed)
Thank you for this update.  Patient is managed well on her current dose of Qulbree.  It is not provider's recommendation to start a stimulant as they can be addicting and patient is doing well.  Patient is currently paying $30 for her medication and reports that she will continue to do this.  No other concerns noted at this time.

## 2023-12-08 ENCOUNTER — Encounter (HOSPITAL_COMMUNITY): Payer: Self-pay | Admitting: Psychiatry

## 2023-12-08 ENCOUNTER — Telehealth (HOSPITAL_COMMUNITY): Payer: No Payment, Other | Admitting: Psychiatry

## 2023-12-08 DIAGNOSIS — F900 Attention-deficit hyperactivity disorder, predominantly inattentive type: Secondary | ICD-10-CM

## 2023-12-08 DIAGNOSIS — F331 Major depressive disorder, recurrent, moderate: Secondary | ICD-10-CM

## 2023-12-08 DIAGNOSIS — F411 Generalized anxiety disorder: Secondary | ICD-10-CM

## 2023-12-08 MED ORDER — GUANFACINE HCL ER 1 MG PO TB24: 1.0000 mg | ORAL_TABLET | Freq: Every day | ORAL | 3 refills | Status: DC

## 2023-12-08 MED ORDER — MIRTAZAPINE 7.5 MG PO TABS: 7.5000 mg | ORAL_TABLET | Freq: Every day | ORAL | 3 refills | Status: DC

## 2023-12-08 NOTE — Progress Notes (Signed)
BH MD/PA/NP OP Progress Note Virtual Visit via Video Note  I connected with Shannon Jimenez on 12/08/23 at  3:30 PM EST by a video enabled telemedicine application and verified that I am speaking with the correct person using two identifiers.  Location: Patient: Car Provider: Clinic   I discussed the limitations of evaluation and management by telemedicine and the availability of in person appointments. The patient expressed understanding and agreed to proceed.  I provided 30 minutes of non-face-to-face time during this encounter.     12/08/2023 3:59 PM Shannon Jimenez  MRN:  [id number]  Chief Complaint: "Life is fine but at time I'm stressed out"    HPI: 23 year old female seen today for follow up psychiatric evaluation. She has a psychiatric history of ADHD, anxiety, and depression.  She is currently managed on mirtazapine 7.5 mg nightly and Qulebree 200 mg daily.  Patient notes that she has not been able to take Leavy Cella because it was 400 $ and her insurance will no longer cover it. Provider attempted to do a prior authorization and it was denied because the patient had not trailed adderall. Provider did not want to place patient on a stimulant as her symptoms were managed with a non stimulant. She has been without the medication for a little over a week. She takes over-the-counter melatonin.  She notes her medication are somewhat effective in managing her psychiatric conditions.  Today she was well groomed, pleasant, cooperative, engaged in conversation, and maintained eye contact.  She informed Clinical research associate that life is fine but at time I'm stressed out. She notes that she is tired of not working. She has been offered a position at a job near her house but has to get her license first. 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  Since her last visit she notes that her mood, anxiety, and depression has  somewhat increased. She notes that she has been coping bu using Character A1 but notes that it has become addicting. She reports that at one point she spent 10 hours on her phone talking to an artificial intellegence voice. She now notes that she spends about 2 hours a day doing this. Provider asked patient what positive thing she could replace this with. She notes reading. She informed Clinical research associate that she would like to travel to book clubs at [employer], poetry events, or clay making events but lack transportation. Provider recommended online book clubs. She notes that she would look in to this.   Today provider conducted a GAD-7 and patient scored a 8, at her last visit she scored a 7.  Provider also conducted PHQ-9 and patient 15, at her last visit she scored a 5.  She notes that her appetite is poor but notes that her sleep is adequate.  Today she denies weight loss/gain.  She denies SI/HI/AVH, mania, paranoia.   Patient informed Clinical research associate that she is spending the day with her future step sister who is visiting for [place]. She note that they are shopping and looking for jobs.   Since being without Qulbree patient notes that she has poor concentration, is forgetful, disorganize, inattentive to mentally taxing task, and had poor listening skills. She also notes that for a few days she developed headache and went through withdrawal symptoms. Patient notes that she did not realize how helpful the medication was. At this time Leavy Cella discontinued as patient  notes that she can't afford it. She will start Intuniv 1 mg nightly to help manage symptoms of ADHD. Patient has tried clonidine, Focalin, and Wellbutrin without assist. No other medication changes made today.  Patient agreeable to continue medications as prescribed. Patient notes that her depression is situational and is able to cope.   No other concerns at this time        Visit Diagnosis:    ICD-10-CM   1. Attention deficit hyperactivity disorder  (ADHD), predominantly inattentive type  F90.0 guanFACINE (INTUNIV) 1 MG TB24 ER tablet    2. Generalized anxiety disorder  F41.1 mirtazapine (REMERON) 7.5 MG tablet    3. Major depressive disorder, recurrent episode, moderate (HCC)  F33.1 mirtazapine (REMERON) 7.5 MG tablet          Past Psychiatric History: anxiety and depression  Past Medical History:  Past Medical History:  Diagnosis Date   ADHD (attention deficit hyperactivity disorder)    Generalized anxiety disorder    Headache    MDD (major depressive disorder)     Past Surgical History:  Procedure Laterality Date   NO PAST SURGERIES      Family Psychiatric History: Maternal Aunt bipolar disorder, depression, and anxiety, maternal mother depression, and anxiety, mother depression and anxiety, sister depression and anxiety   Family History:  Family History  Problem Relation Age of Onset   Migraines Mother    ADD / ADHD Mother    Anxiety disorder Mother    Depression Mother    Depression Maternal Aunt    Migraines Maternal Aunt    Depression Maternal Uncle    Anxiety disorder Maternal Grandmother    Depression Maternal Grandmother    Migraines Paternal Grandmother    Other Paternal Grandfather        Died at the age of 5 due to Flu/H1N1   Seizures Neg Hx    Autism Neg Hx    Bipolar disorder Neg Hx    Schizophrenia Neg Hx     Social History:  Social History   Socioeconomic History   Marital status: Single    Spouse name: Not on file   Number of children: Not on file   Years of education: Not on file   Highest education level: Not on file  Occupational History   Not on file  Tobacco Use   Smoking status: Never   Smokeless tobacco: Never  Vaping Use   Vaping status: Never Used  Substance and Sexual Activity   Alcohol use: Never   Drug use: Never   Sexual activity: Never  Other Topics Concern   Not on file  Social History Narrative   Not on file   Social Drivers of Health   Financial  Resource Strain: Not on file  Food Insecurity: Not on file  Transportation Needs: Not on file  Physical Activity: Not on file  Stress: Not on file  Social Connections: Not on file    Allergies:  Allergies  Allergen Reactions   Daytrana [Methylphenidate]     Reaction unknown at age 73   Focalin [Dexmethylphenidate Hcl]    Other     Mahi Mahi causes burning sensation in mouth   Strattera [Atomoxetine] Rash    migraines   Wellbutrin [Bupropion] Rash    Metabolic Disorder Labs: No results found for: "HGBA1C", "MPG" No results found for: "PROLACTIN" No results found for: "CHOL", "TRIG", "HDL", "CHOLHDL", "VLDL", "LDLCALC" No results found for: "TSH"  Therapeutic Level Labs: No results found for: "LITHIUM" No  results found for: "VALPROATE" No results found for: "CBMZ"  Current Medications: Current Outpatient Medications  Medication Sig Dispense Refill   guanFACINE (INTUNIV) 1 MG TB24 ER tablet Take 1 tablet (1 mg total) by mouth daily. 30 tablet 3   ibuprofen (ADVIL) 100 MG tablet Take 100 mg by mouth as needed for fever.     MELATONIN PO Take by mouth at bedtime.     mirtazapine (REMERON) 7.5 MG tablet Take 1 tablet (7.5 mg total) by mouth at bedtime. 30 tablet 3   viloxazine ER (QELBREE) 200 MG 24 hr capsule Take 1 capsule (200 mg total) by mouth daily. 30 capsule 3   No current facility-administered medications for this visit.     Musculoskeletal: Strength & Muscle Tone: within normal limits and  telehealth visit Gait & Station: normal, telehealth visit Patient leans: N/A  Psychiatric Specialty Exam: Review of Systems  There were no vitals taken for this visit.There is no height or weight on file to calculate BMI.  General Appearance: Well Groomed  Eye Contact:  Good  Speech:  Clear and Coherent and Normal Rate  Volume:  Normal  Mood:  Depressed  Affect:  Appropriate and Congruent  Thought Process:  Coherent, Goal Directed and Linear  Orientation:  Full (Time,  Place, and Person)  Thought Content: WDL and Logical   Suicidal Thoughts:  No  Homicidal Thoughts:  No  Memory:  Immediate;   Good Recent;   Good Remote;   Good  Judgement:  Good  Insight:  Good  Psychomotor Activity:  Normal  Concentration:  Concentration: Good and Attention Span: Good  Recall:  Good  Fund of Knowledge: Good  Language: Good  Akathisia:  No  Handed:  Right  AIMS (if indicated): Not done  Assets:  Communication Skills Desire for Improvement Financial Resources/Insurance Housing Leisure Time Physical Health Social Support  ADL's:  Intact  Cognition: WNL  Sleep:  Good   Screenings: GAD-7    Flowsheet Row Video Visit from 12/08/2023 in [facility] Video Visit from 10/11/2023 in [facility] Video Visit from 07/19/2023 in [facility] Video Visit from 05/05/2023 in [facility] Video Visit from 03/03/2023 in [facility]  Total GAD-7 Score 8 7 2 3 4       PHQ2-9    Flowsheet Row Video Visit from 12/08/2023 in [facility] Video Visit from 10/11/2023 in [facility] Video Visit from 07/19/2023 in [facility] Video Visit from 05/05/2023 in [facility] Office Visit from 03/16/2023 in [facility] [place]  PHQ-2 Total Score 5 2 0 0 0  PHQ-9 Total Score 15 5 7 6 6       Flowsheet Row Video Visit from 12/08/2023 in [facility] Video Visit from 09/23/2022 in [facility] Video Visit from 11/12/2021 in [facility]  C-SSRS RISK CATEGORY Error: Q7 should not be populated when Q6 is No Error: Q7 should not be populated when Q6 is No Error: Q7 should not be populated when Q6 is No        Assessment and Plan:Since being  without Leavy Cella patient notes that she has poor concentration, is forgetful, disorganize, inattentive to mentally taxing task, and had poor listening skills. She also notes that for a few days she developed headache and went through withdrawal symptoms.  Patient notes that she did not realize how helpful the medication was. At this time Leavy Cella discontinued as patient notes that she can't afford it. She will start Intuniv 1 mg nightly to help manage symptoms of ADHD. Patient has tried clonidine, Focalin, and Wellbutrin without assist. No other medication changes made today.  Patient agreeable to continue medications as prescribed. Patient notes that her depression is situational and is able to cope.   1. Attention deficit hyperactivity disorder (ADHD), predominantly inattentive type  Start- guanFACINE (INTUNIV) 1 MG TB24 ER tablet; Take 1 tablet (1 mg total) by mouth daily.  Dispense: 30 tablet; Refill: 3  2. Generalized anxiety disorder  Continue- mirtazapine (REMERON) 7.5 MG tablet; Take 1 tablet (7.5 mg total) by mouth at bedtime.  Dispense: 30 tablet; Refill: 3  3. Major depressive disorder, recurrent episode, moderate (HCC)  Continue- mirtazapine (REMERON) 7.5 MG tablet; Take 1 tablet (7.5 mg total) by mouth at bedtime.  Dispense: 30 tablet; Refill: 3  Follow-up in 2.5 months Follow-up with therapy   Shanna Cisco, NP 12/08/2023, 3:59 PM

## 2024-01-11 ENCOUNTER — Telehealth (HOSPITAL_COMMUNITY): Payer: Self-pay | Admitting: *Deleted

## 2024-01-11 NOTE — Telephone Encounter (Signed)
 PA request for [facility] received from covermymeds. Reveiwed chart and quelbree discontinued per Dr Doyne Keel as it was denied coverage when initial PA done and now she is starting Intuniv.

## 2024-01-30 ENCOUNTER — Other Ambulatory Visit (HOSPITAL_COMMUNITY): Payer: Self-pay

## 2024-01-31 ENCOUNTER — Telehealth (HOSPITAL_COMMUNITY): Payer: Self-pay

## 2024-01-31 NOTE — Telephone Encounter (Signed)
 Pts   Qelbree 200MG  er capsules has been started and approved from 01/31/24-01/30/2025

## 2024-03-08 ENCOUNTER — Encounter (HOSPITAL_COMMUNITY): Payer: Self-pay | Admitting: Psychiatry

## 2024-03-08 ENCOUNTER — Telehealth (HOSPITAL_COMMUNITY): Payer: No Payment, Other | Admitting: Psychiatry

## 2024-03-08 DIAGNOSIS — F331 Major depressive disorder, recurrent, moderate: Secondary | ICD-10-CM

## 2024-03-08 DIAGNOSIS — F900 Attention-deficit hyperactivity disorder, predominantly inattentive type: Secondary | ICD-10-CM | POA: Diagnosis not present

## 2024-03-08 DIAGNOSIS — F411 Generalized anxiety disorder: Secondary | ICD-10-CM | POA: Diagnosis not present

## 2024-03-08 MED ORDER — MIRTAZAPINE 7.5 MG PO TABS: 7.5000 mg | ORAL_TABLET | Freq: Every day | ORAL | 3 refills | Status: DC

## 2024-03-08 MED ORDER — GUANFACINE HCL ER 2 MG PO TB24: 2.0000 mg | ORAL_TABLET | Freq: Every day | ORAL | 3 refills | Status: DC

## 2024-03-08 NOTE — Progress Notes (Signed)
 BH MD/PA/NP OP Progress Note Virtual Visit via Video Note  I connected with Shannon Jimenez on 03/08/24 at  1:30 PM EDT by a video enabled telemedicine application and verified that I am speaking with the correct person using two identifiers.  Location: Patient: Car Provider: Clinic   I discussed the limitations of evaluation and management by telemedicine and the availability of in person appointments. The patient expressed understanding and agreed to proceed.  I provided 30 minutes of non-face-to-face time during this encounter.     03/08/2024 2:29 PM Shannon Jimenez  MRN:  [id number]  Chief Complaint: "Intuniv  is not as successful as Shannon Jimenez"    HPI: 23 year old female seen today for follow up psychiatric evaluation. She has a psychiatric history of ADHD, anxiety, and depression.  She is currently managed on mirtazapine  7.5 mg nightly and Intuniv  1 mg. She also takes over-the-counter melatonin.  She notes her medication are somewhat effective in managing her psychiatric conditions.  Today she was well groomed, pleasant, cooperative, engaged in conversation, and maintained eye contact.  She informed Clinical research associate that Intuniv  has not been as effective as Shannon Jimenez. [employer] prior authorization was denied and the medication was $400 out of pocket. Provider recommended increasing Intuniv . She was agreeable.  Patient notes that she has been offered a position at a job near her house but has to get her license first. She reports that this has been difficult because the DNVs has been packed. 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   Since her last visit she notes that her mood, anxiety, and depression has been well managed. She notes that she is socializing more with friends instead of using Character A1 artificial intellegence game. She also notes she has look into online book clubs.  Patient reports that she  and her future stepsiblings are preparing this song for her mother and future stepfather's wedding.  Mentally patient notes that she is in good.  Today provider conducted a GAD-7 and patient scored a 3, at her last visit she scored a 8.  Provider also conducted PHQ-9 and patient 8, at her last visit she scored a 15.  She notes that her appetite is poor reporting that she eats 1 meal a day.  She denies binge/purge behaviors.  She does report that she is lost several pounds since her last visit.  She endorsed sleeping adequately. She denies SI/HI/AVH, mania, paranoia.   Today patient agreeable to increasing Intuniv  1 mg nightly to 2 mg nightly help manage symptoms of ADHD. Patient has tried clonidine , Focalin, and Wellbutrin without success.  She prefers to stay away from stimulants.  No other medication changes made today.  Patient agreeable to continue medications as prescribed.   No other concerns at this time        Visit Diagnosis:    ICD-10-CM   1. Attention deficit hyperactivity disorder (ADHD), predominantly inattentive type  F90.0 guanFACINE  (INTUNIV ) 2 MG TB24 ER tablet    2. Generalized anxiety disorder  F41.1 mirtazapine  (REMERON ) 7.5 MG tablet    3. Major depressive disorder, recurrent episode, moderate (HCC)  F33.1 mirtazapine  (REMERON ) 7.5 MG tablet           Past Psychiatric History: anxiety and depression  Past Medical History:  Past Medical History:  Diagnosis Date   ADHD (attention deficit hyperactivity disorder)    Generalized anxiety disorder    Headache  MDD (major depressive disorder)     Past Surgical History:  Procedure Laterality Date   NO PAST SURGERIES      Family Psychiatric History: Maternal Aunt bipolar disorder, depression, and anxiety, maternal mother depression, and anxiety, mother depression and anxiety, sister depression and anxiety   Family History:  Family History  Problem Relation Age of Onset   Migraines Mother    ADD / ADHD  Mother    Anxiety disorder Mother    Depression Mother    Depression Maternal Aunt    Migraines Maternal Aunt    Depression Maternal Uncle    Anxiety disorder Maternal Grandmother    Depression Maternal Grandmother    Migraines Paternal Grandmother    Other Paternal Grandfather        Died at the age of 82 due to Flu/H1N1   Seizures Neg Hx    Autism Neg Hx    Bipolar disorder Neg Hx    Schizophrenia Neg Hx     Social History:  Social History   Socioeconomic History   Marital status: Single    Spouse name: Not on file   Number of children: Not on file   Years of education: Not on file   Highest education level: Not on file  Occupational History   Not on file  Tobacco Use   Smoking status: Never   Smokeless tobacco: Never  Vaping Use   Vaping status: Never Used  Substance and Sexual Activity   Alcohol  use: Never   Drug use: Never   Sexual activity: Never  Other Topics Concern   Not on file  Social History Narrative   Not on file   Social Drivers of Health   Financial Resource Strain: Not on file  Food Insecurity: Not on file  Transportation Needs: Not on file  Physical Activity: Not on file  Stress: Not on file  Social Connections: Not on file    Allergies:  Allergies  Allergen Reactions   Daytrana [Methylphenidate]     Reaction unknown at age 66   Focalin [Dexmethylphenidate Hcl]    Other     Mahi Mahi causes burning sensation in mouth   Strattera [Atomoxetine] Rash    migraines   Wellbutrin [Bupropion] Rash    Metabolic Disorder Labs: No results found for: "HGBA1C", "MPG" No results found for: "PROLACTIN" No results found for: "CHOL", "TRIG", "HDL", "CHOLHDL", "VLDL", "LDLCALC" No results found for: "TSH"  Therapeutic Level Labs: No results found for: "LITHIUM" No results found for: "VALPROATE" No results found for: "CBMZ"  Current Medications: Current Outpatient Medications  Medication Sig Dispense Refill   guanFACINE  (INTUNIV ) 2 MG TB24  ER tablet Take 1 tablet (2 mg total) by mouth daily. 30 tablet 3   ibuprofen (ADVIL) 100 MG tablet Take 100 mg by mouth as needed for fever.     MELATONIN PO Take by mouth at bedtime.     mirtazapine  (REMERON ) 7.5 MG tablet Take 1 tablet (7.5 mg total) by mouth at bedtime. 30 tablet 3   viloxazine ER (QELBREE) 200 MG 24 hr capsule Take 1 capsule (200 mg total) by mouth daily. 30 capsule 3   No current facility-administered medications for this visit.     Musculoskeletal: Strength & Muscle Tone: within normal limits and  telehealth visit Gait & Station: normal, telehealth visit Patient leans: N/A  Psychiatric Specialty Exam: Review of Systems  There were no vitals taken for this visit.There is no height or weight on file to calculate BMI.  General Appearance: Well Groomed  Eye Contact:  Good  Speech:  Clear and Coherent and Normal Rate  Volume:  Normal  Mood:  Euthymic  Affect:  Appropriate and Congruent  Thought Process:  Coherent, Goal Directed and Linear  Orientation:  Full (Time, Place, and Person)  Thought Content: WDL and Logical   Suicidal Thoughts:  No  Homicidal Thoughts:  No  Memory:  Immediate;   Good Recent;   Good Remote;   Good  Judgement:  Good  Insight:  Good  Psychomotor Activity:  Normal  Concentration:  Concentration: Good and Attention Span: Good  Recall:  Good  Fund of Knowledge: Good  Language: Good  Akathisia:  No  Handed:  Right  AIMS (if indicated): Not done  Assets:  Communication Skills Desire for Improvement Financial Resources/Insurance Housing Leisure Time Physical Health Social Support  ADL's:  Intact  Cognition: WNL  Sleep:  Good   Screenings: GAD-7    Flowsheet Row Video Visit from 03/08/2024 in [facility] Video Visit from 12/08/2023 in [facility] Video Visit from 10/11/2023 in [facility] Video Visit from 07/19/2023 in [facility] Video Visit from 05/05/2023 in [facility]  Total GAD-7 Score 3 8 7 2 3       PHQ2-9    Flowsheet Row Video Visit from 03/08/2024 in [facility] Video Visit from 12/08/2023 in [facility] Video Visit from 10/11/2023 in [facility] Video Visit from 07/19/2023 in [facility] Video Visit from 05/05/2023 in [facility]  PHQ-2 Total Score 1 5 2  0 0  PHQ-9 Total Score 8 15 5 7 6       Flowsheet Row Video Visit from 12/08/2023 in [facility] Video Visit from 09/23/2022 in [facility] Video Visit from 11/12/2021 in [facility]  C-SSRS RISK CATEGORY Error: Q7 should not be populated when Q6 is No Error: Q7 should not be populated when Q6 is No Error: Q7 should not be populated when Q6 is No        Assessment and Plan:Since being without Shannon Jimenez patient notes that she has poor concentration, is forgetful, disorganize, inattentive to mentally taxing task, and had poor listening skills.  She reports that Intuniv  is helpful but not as effective as  Shannon Jimenez. Today patient agreeable to increasing Intuniv  1 mg nightly to 2 mg nightly help manage symptoms of ADHD. Patient has tried clonidine , Focalin, and Wellbutrin without success.  She prefers to stay away from stimulants.  No other medication changes made today.  Patient agreeable to continue medications as prescribed.  1. Attention deficit hyperactivity disorder (ADHD), predominantly inattentive type  Increased- guanFACINE  (INTUNIV ) 2 MG TB24 ER tablet; Take 1 tablet (2 mg total) by mouth daily.  Dispense: 30 tablet; Refill: 3  2. Generalized anxiety disorder  Continue- mirtazapine  (REMERON ) 7.5 MG tablet; Take 1 tablet (7.5 mg total) by mouth at bedtime.  Dispense: 30 tablet;  Refill: 3  3. Major depressive disorder, recurrent episode, moderate (HCC)  Continue- mirtazapine  (REMERON ) 7.5 MG tablet; Take 1 tablet (7.5 mg total) by mouth at bedtime.  Dispense: 30 tablet; Refill: 3   Follow-up in 2.5 months Follow-up with therapy   Arlyne Bering, NP 03/08/2024, 2:29 PM

## 2024-05-24 ENCOUNTER — Telehealth (HOSPITAL_COMMUNITY): Admitting: Family

## 2024-05-24 ENCOUNTER — Encounter (HOSPITAL_COMMUNITY): Payer: Self-pay | Admitting: Family

## 2024-05-24 DIAGNOSIS — F411 Generalized anxiety disorder: Secondary | ICD-10-CM

## 2024-05-24 DIAGNOSIS — F900 Attention-deficit hyperactivity disorder, predominantly inattentive type: Secondary | ICD-10-CM

## 2024-05-24 DIAGNOSIS — F331 Major depressive disorder, recurrent, moderate: Secondary | ICD-10-CM

## 2024-05-24 MED ORDER — GUANFACINE HCL ER 2 MG PO TB24: 2.0000 mg | ORAL_TABLET | Freq: Every day | ORAL | 3 refills | Status: DC

## 2024-05-24 MED ORDER — MIRTAZAPINE 7.5 MG PO TABS: 7.5000 mg | ORAL_TABLET | Freq: Every day | ORAL | 3 refills | Status: DC

## 2024-05-24 NOTE — Progress Notes (Signed)
 BH MD/PA/NP OP Progress Note Virtual Visit via Video Note  I connected with Shannon Jimenez on 05/24/24 at  1:00 PM EDT by a video enabled telemedicine application and verified that I am speaking with the correct person using two identifiers.  Location: Patient: Shannon Jimenez: Clinic   I discussed the limitations of evaluation and management by telemedicine and the availability of in person appointments. The patient expressed understanding and agreed to proceed.  I provided 30 minutes of non-face-to-face time during this encounter.   05/24/2024 4:44 PM Shannon Jimenez  MRN:  [id number]  Chief Complaint: Intuniv  is not as successful as Particia  HPI: 23 year old female seen today for follow up psychiatric evaluation. She has a psychiatric history of ADHD, anxiety, and depression.  She is currently managed on mirtazapine  7.5 mg nightly and Intuniv  1 mg. She also takes over-the-counter melatonin.  She notes her medication are somewhat effective in managing her psychiatric conditions.  Patient was seen and assessed today, presenting as well-groomed, pleasant, cooperative, and engaged in conversation, maintaining appropriate eye contact. She reported that her mood, anxiety, and depression have been well managed since her last visit and described herself as "in good spirits." A GAD-7 administered today yielded a score of 3, improved from 8 at her previous visit. She endorsed sleeping adequately overall, though sometimes reports excessive sleep of 10-11 hours per night, which she attributes to taking melatonin, guanfacine  (Intuniv ), and mirtazapine  around 11 PM. She was counseled on shifting her melatonin intake earlier in the evening to prevent oversleeping, which she agreed to try.  The patient recently celebrated her 23st birthday and was proud to have made responsible decisions, choosing to abstain from alcohol . She has also engaged in positive coping activities such as sewing and has  completed six to seven clothing projects since her last visit, noting that staying busy helps reduce binge eating tendencies. She reports improved eating habits, now consuming two to three healthy meals daily and following up with a dietician for support.  She denied suicidal ideation, homicidal ideation, auditory or visual hallucinations, paranoia, or manic symptoms. Sleep quality is generally good, and she continues to focus on healthy lifestyle changes.   Visit Diagnosis:    ICD-10-CM   1. Attention deficit hyperactivity disorder (ADHD), predominantly inattentive type  F90.0 guanFACINE  (INTUNIV ) 2 MG TB24 ER tablet    2. Major depressive disorder, recurrent episode, moderate (HCC)  F33.1 mirtazapine  (REMERON ) 7.5 MG tablet    3. Generalized anxiety disorder  F41.1 mirtazapine  (REMERON ) 7.5 MG tablet       Past Psychiatric History: anxiety and depression  Past Medical History:  Past Medical History:  Diagnosis Date   ADHD (attention deficit hyperactivity disorder)    Generalized anxiety disorder    Headache    MDD (major depressive disorder)     Past Surgical History:  Procedure Laterality Date   NO PAST SURGERIES      Family Psychiatric History: Maternal Aunt bipolar disorder, depression, and anxiety, maternal mother depression, and anxiety, mother depression and anxiety, sister depression and anxiety   Family History:  Family History  Problem Relation Age of Onset   Migraines Mother    ADD / ADHD Mother    Anxiety disorder Mother    Depression Mother    Depression Maternal Aunt    Migraines Maternal Aunt    Depression Maternal Uncle    Anxiety disorder Maternal Grandmother    Depression Maternal Grandmother    Migraines Paternal Grandmother  Other Paternal Grandfather        Died at the age of 21 due to Flu/H1N1   Seizures Neg Hx    Autism Neg Hx    Bipolar disorder Neg Hx    Schizophrenia Neg Hx     Social History:  Social History   Socioeconomic  History   Marital status: Single    Spouse name: Not on file   Number of children: Not on file   Years of education: Not on file   Highest education level: Not on file  Occupational History   Not on file  Tobacco Use   Smoking status: Never   Smokeless tobacco: Never  Vaping Use   Vaping status: Never Used  Substance and Sexual Activity   Alcohol  use: Never   Drug use: Never   Sexual activity: Never  Other Topics Concern   Not on file  Social History Narrative   Not on file   Social Drivers of Health   Financial Resource Strain: Not on file  Food Insecurity: Not on file  Transportation Needs: Not on file  Physical Activity: Not on file  Stress: Not on file  Social Connections: Not on file    Allergies:  Allergies  Allergen Reactions   Daytrana [Methylphenidate]     Reaction unknown at age 53   Focalin [Dexmethylphenidate Hcl]    Other     Mahi Mahi causes burning sensation in mouth   Strattera [Atomoxetine] Rash    migraines   Wellbutrin [Bupropion] Rash    Metabolic Disorder Labs: No results found for: HGBA1C, MPG No results found for: PROLACTIN No results found for: CHOL, TRIG, HDL, CHOLHDL, VLDL, LDLCALC No results found for: TSH  Therapeutic Level Labs: No results found for: LITHIUM No results found for: VALPROATE No results found for: CBMZ  Current Medications: Current Outpatient Medications  Medication Sig Dispense Refill   guanFACINE  (INTUNIV ) 2 MG TB24 ER tablet Take 1 tablet (2 mg total) by mouth daily. 30 tablet 3   ibuprofen (ADVIL) 100 MG tablet Take 100 mg by mouth as needed for fever.     MELATONIN PO Take by mouth at bedtime.     mirtazapine  (REMERON ) 7.5 MG tablet Take 1 tablet (7.5 mg total) by mouth at bedtime. 30 tablet 3   viloxazine ER (QELBREE) 200 MG 24 hr capsule Take 1 capsule (200 mg total) by mouth daily. 30 capsule 3   No current facility-administered medications for this visit.      Musculoskeletal: Strength & Muscle Tone: within normal limits and  telehealth visit Gait & Station: normal, telehealth visit Patient leans: N/A  Psychiatric Specialty Exam: Review of Systems  Neurological: Negative.   Psychiatric/Behavioral: Negative.    All other systems reviewed and are negative.   There were no vitals taken for this visit.There is no height or weight on file to calculate BMI.  General Appearance: Well Groomed  Eye Contact:  Good  Speech:  Clear and Coherent and Normal Rate  Volume:  Normal  Mood:  Euthymic  Affect:  Appropriate and Congruent  Thought Process:  Coherent, Goal Directed and Linear  Orientation:  Full (Time, Place, and Person)  Thought Content: WDL and Logical   Suicidal Thoughts:  No  Homicidal Thoughts:  No  Memory:  Immediate;   Good Recent;   Good Remote;   Good  Judgement:  Good  Insight:  Good  Psychomotor Activity:  Normal  Concentration:  Concentration: Good and Attention Span: Good  Recall:  Good  Fund of Knowledge: Good  Language: Good  Akathisia:  No  Handed:  Right  AIMS (if indicated): Not done  Assets:  Communication Skills Desire for Improvement Financial Resources/Insurance Housing Leisure Time Physical Health Social Support  ADL's:  Intact  Cognition: WNL  Sleep:  Good   Screenings: GAD-7    Flowsheet Row Video Visit from 03/08/2024 in [facility] Video Visit from 12/08/2023 in [facility] Video Visit from 10/11/2023 in [facility] Video Visit from 07/19/2023 in [facility] Video Visit from 05/05/2023 in [facility]  Total GAD-7 Score 3 8 7 2 3    PHQ2-9    Flowsheet Row Video Visit from 03/08/2024 in [facility] Video Visit from 12/08/2023 in [facility] Video Visit from 10/11/2023 in [facility] Video Visit from 07/19/2023 in [facility] Video Visit from 05/05/2023 in [facility]  PHQ-2 Total Score 1 5 2  0 0  PHQ-9 Total Score 8 15 5 7 6    Flowsheet Row Video Visit from 12/08/2023 in [facility] Video Visit from 09/23/2022 in [facility] Video Visit from 11/12/2021 in [facility]  C-SSRS RISK CATEGORY Error: Q7 should not be populated when Q6 is No Error: Q7 should not be populated when Q6 is No Error: Q7 should not be populated when Q6 is No     Assessment and Plan:Since being without Particia patient notes that she has poor concentration, is forgetful, disorganize, inattentive to mentally taxing task, and had poor listening skills.  She reports that Intuniv  is helpful but not as effective as  Particia. Today patient agreeable to increasing Intuniv  1 mg nightly to 2 mg nightly help manage symptoms of ADHD. Patient has tried clonidine , Focalin, and Wellbutrin without success.  She prefers to stay away from stimulants.  No other medication changes made today.  Patient agreeable to continue medications as prescribed.  1. Attention deficit hyperactivity disorder (ADHD), predominantly inattentive type  Continue- guanFACINE  (INTUNIV ) 2 MG TB24 ER tablet; Take 1 tablet (2 mg total) by mouth daily.  Dispense: 30 tablet; Refill: 3  2. Generalized anxiety disorder  Continue- mirtazapine  (REMERON ) 7.5 MG tablet; Take 1 tablet (7.5 mg total) by mouth at bedtime.  Dispense: 30 tablet; Refill: 3  3. Major depressive disorder, recurrent episode, moderate (HCC)  Continue- mirtazapine  (REMERON ) 7.5 MG tablet; Take 1 tablet (7.5 mg total) by mouth at bedtime.  Dispense: 30 tablet; Refill: 3   Follow-up in 2.5 months Follow-up with therapy   Majel GORMAN Ramp, FNP 05/24/2024, 4:44 PM

## 2024-05-31 ENCOUNTER — Telehealth (HOSPITAL_COMMUNITY): Admitting: Psychiatry

## 2024-08-22 ENCOUNTER — Telehealth (INDEPENDENT_AMBULATORY_CARE_PROVIDER_SITE_OTHER): Admitting: Psychiatry

## 2024-08-22 ENCOUNTER — Encounter (HOSPITAL_COMMUNITY): Payer: Self-pay | Admitting: Psychiatry

## 2024-08-22 DIAGNOSIS — F331 Major depressive disorder, recurrent, moderate: Secondary | ICD-10-CM | POA: Diagnosis not present

## 2024-08-22 DIAGNOSIS — F411 Generalized anxiety disorder: Secondary | ICD-10-CM

## 2024-08-22 MED ORDER — MIRTAZAPINE 7.5 MG PO TABS: 7.5000 mg | ORAL_TABLET | Freq: Every day | ORAL | 3 refills | Status: DC

## 2024-08-22 NOTE — Progress Notes (Signed)
 BH MD/PA/NP OP Progress Note Virtual Visit via Video Note  I connected with Shannon Jimenez on 08/22/24 at  1:00 PM EDT by a video enabled telemedicine application and verified that I am speaking with the correct person using two identifiers.  Location: Patient: Home Provider: Clinic   I discussed the limitations of evaluation and management by telemedicine and the availability of in person appointments. The patient expressed understanding and agreed to proceed.  I provided 30 minutes of non-face-to-face time during this encounter.     08/22/2024 1:21 PM Shannon Jimenez  MRN:  [id number]  Chief Complaint: I am doing okay    HPI: 23 year old female seen today for follow up psychiatric evaluation. She has a psychiatric history of ADHD, anxiety, and depression.  She is currently managed on mirtazapine  7.5 mg nightly and Intuniv  1 mg. She also takes over-the-counter melatonin.  She informed clinical research associate that she has discontinued Intuniv .  She notes her medication are somewhat effective in managing her psychiatric conditions.  Today she was well groomed, pleasant, cooperative, engaged in conversation, and maintained eye contact.  She informed clinical research associate that she has been doing okay. She reports that recent that she recently deep cleaned her room. She notes that she found over 60 unfinished projects. She notes that she is now finishing a purse. Patient also notes that she is trying to bond more with her [place] stepfather. At times she notes that its difficult due to his racial biases but notes that she is able to see past this. She notes that she has been practicing driving and is hopeful that she will pass her driving driving test. At times patient notes that she feels stagnant but is trying to plan things for the future by doing things such as have a job at [employer], do unfinished art projects (notes that she started with 66 and now has 49), completing books with a friend, and  hanging out with friends.  Patient notes that she now has [employer] insurance. She reports that she is interested restarting Particia as she finds intuniv  ineffective noting it caused fatigue and did not help her concentration.  Provider informed patient that Particia would be sent to the preferred pharmacy after she checks her insurance.  She endorsed understanding and agreed.  Patient reports that her anxiety and depression are well-managed.  Today provider conducted a GAD-7 and patient scored a 4.  Provider also conducted PHQ-9 of a score of 5.  She endorses adequate sleep.  Patient notes at times she has hunger pains but does note that she is eating 3 meals daily.  She denies purging behaviors but does note that she binges is at times.  She reports losing over 20 pounds by dieting and exercising within the last few months.  Today she denies SI/HI/VAH, mania, paranoia   At this time Intuniv  discontinued as patient finds it ineffective.  Patient will check with her insurance to see if Particia is covered.  She will continue mirtazapine  and melatonin as prescribed. No other concerns at this time        Visit Diagnosis:    ICD-10-CM   1. Major depressive disorder, recurrent episode, moderate (HCC)  F33.1 mirtazapine  (REMERON ) 7.5 MG tablet    2. Generalized anxiety disorder  F41.1 mirtazapine  (REMERON ) 7.5 MG tablet            Past Psychiatric History: anxiety and depression  Past Medical History:  Past Medical History:  Diagnosis  Date   ADHD (attention deficit hyperactivity disorder)    Generalized anxiety disorder    Headache    MDD (major depressive disorder)     Past Surgical History:  Procedure Laterality Date   NO PAST SURGERIES      Family Psychiatric History: Maternal Aunt bipolar disorder, depression, and anxiety, maternal mother depression, and anxiety, mother depression and anxiety, sister depression and anxiety   Family History:  Family History   Problem Relation Age of Onset   Migraines Mother    ADD / ADHD Mother    Anxiety disorder Mother    Depression Mother    Depression Maternal Aunt    Migraines Maternal Aunt    Depression Maternal Uncle    Anxiety disorder Maternal Grandmother    Depression Maternal Grandmother    Migraines Paternal Grandmother    Other Paternal Grandfather        Died at the age of 9 due to Flu/H1N1   Seizures Neg Hx    Autism Neg Hx    Bipolar disorder Neg Hx    Schizophrenia Neg Hx     Social History:  Social History   Socioeconomic History   Marital status: Single    Spouse name: Not on file   Number of children: Not on file   Years of education: Not on file   Highest education level: Not on file  Occupational History   Not on file  Tobacco Use   Smoking status: Never   Smokeless tobacco: Never  Vaping Use   Vaping status: Never Used  Substance and Sexual Activity   Alcohol  use: Never   Drug use: Never   Sexual activity: Never  Other Topics Concern   Not on file  Social History Narrative   Not on file   Social Drivers of Health   Financial Resource Strain: Not on file  Food Insecurity: Not on file  Transportation Needs: Not on file  Physical Activity: Not on file  Stress: Not on file  Social Connections: Not on file    Allergies:  Allergies  Allergen Reactions   Daytrana [Methylphenidate]     Reaction unknown at age 51   Focalin [Dexmethylphenidate Hcl]    Other     Mahi Mahi causes burning sensation in mouth   Strattera [Atomoxetine] Rash    migraines   Wellbutrin [Bupropion] Rash    Metabolic Disorder Labs: No results found for: HGBA1C, MPG No results found for: PROLACTIN No results found for: CHOL, TRIG, HDL, CHOLHDL, VLDL, LDLCALC No results found for: TSH  Therapeutic Level Labs: No results found for: LITHIUM No results found for: VALPROATE No results found for: CBMZ  Current Medications: Current Outpatient Medications   Medication Sig Dispense Refill   ibuprofen (ADVIL) 100 MG tablet Take 100 mg by mouth as needed for fever.     MELATONIN PO Take by mouth at bedtime.     mirtazapine  (REMERON ) 7.5 MG tablet Take 1 tablet (7.5 mg total) by mouth at bedtime. 30 tablet 3   viloxazine ER (QELBREE) 200 MG 24 hr capsule Take 1 capsule (200 mg total) by mouth daily. 30 capsule 3   No current facility-administered medications for this visit.     Musculoskeletal: Strength & Muscle Tone: within normal limits and  telehealth visit Gait & Station: normal, telehealth visit Patient leans: N/A  Psychiatric Specialty Exam: Review of Systems  There were no vitals taken for this visit.There is no height or weight on file to calculate BMI.  General Appearance: Well Groomed  Eye Contact:  Good  Speech:  Clear and Coherent and Normal Rate  Volume:  Normal  Mood:  Euthymic  Affect:  Appropriate and Congruent  Thought Process:  Coherent, Goal Directed and Linear  Orientation:  Full (Time, Place, and Person)  Thought Content: WDL and Logical   Suicidal Thoughts:  No  Homicidal Thoughts:  No  Memory:  Immediate;   Good Recent;   Good Remote;   Good  Judgement:  Good  Insight:  Good  Psychomotor Activity:  Normal  Concentration:  Concentration: Good and Attention Span: Good  Recall:  Good  Fund of Knowledge: Good  Language: Good  Akathisia:  No  Handed:  Right  AIMS (if indicated): Not done  Assets:  Communication Skills Desire for Improvement Financial Resources/Insurance Housing Leisure Time Physical Health Social Support  ADL's:  Intact  Cognition: WNL  Sleep:  Good   Screenings: GAD-7    Flowsheet Row Video Visit from 08/22/2024 in [facility] Video Visit from 03/08/2024 in [facility] Video Visit from 12/08/2023 in [facility] Video Visit from 10/11/2023 in [facility] Video Visit  from 07/19/2023 in [facility]  Total GAD-7 Score 4 3 8 7 2    PHQ2-9    Flowsheet Row Video Visit from 08/22/2024 in [facility] Video Visit from 03/08/2024 in [facility] Video Visit from 12/08/2023 in [facility] Video Visit from 10/11/2023 in [facility] Video Visit from 07/19/2023 in [facility]  PHQ-2 Total Score 2 1 5 2  0  PHQ-9 Total Score 5 8 15 5 7    Flowsheet Row Video Visit from 12/08/2023 in [facility] Video Visit from 09/23/2022 in [facility] Video Visit from 11/12/2021 in [facility]  C-SSRS RISK CATEGORY Error: Q7 should not be populated when Q6 is No Error: Q7 should not be populated when Q6 is No Error: Q7 should not be populated when Q6 is No     Assessment and Plan: Patient presents anxiety, depression, and sleep has improved.  She continues to have issues with concentration.  Patient notes that he finds Intuniv  ineffective.  At this time Intuniv  discontinued.  Patient will check with her insurance to see if Particia is covered.  She will continue mirtazapine  and melatonin as prescribed.   1. Major depressive disorder, recurrent episode, moderate (HCC)  Continue- mirtazapine  (REMERON ) 7.5 MG tablet; Take 1 tablet (7.5 mg total) by mouth at bedtime.  Dispense: 30 tablet; Refill: 3  2. Generalized anxiety disorder  Continue- mirtazapine  (REMERON ) 7.5 MG tablet; Take 1 tablet (7.5 mg total) by mouth at bedtime.  Dispense: 30 tablet; Refill: 3  Follow-up in 2.5 months Follow-up with therapy   Zane FORBES Bach, NP 08/22/2024, 1:21 PM

## 2024-11-08 ENCOUNTER — Encounter (HOSPITAL_COMMUNITY): Payer: Self-pay | Admitting: Psychiatry

## 2024-11-08 ENCOUNTER — Telehealth (HOSPITAL_COMMUNITY): Admitting: Psychiatry

## 2024-11-08 DIAGNOSIS — F411 Generalized anxiety disorder: Secondary | ICD-10-CM | POA: Diagnosis not present

## 2024-11-08 DIAGNOSIS — F331 Major depressive disorder, recurrent, moderate: Secondary | ICD-10-CM | POA: Diagnosis not present

## 2024-11-08 DIAGNOSIS — F900 Attention-deficit hyperactivity disorder, predominantly inattentive type: Secondary | ICD-10-CM

## 2024-11-08 MED ORDER — MIRTAZAPINE 7.5 MG PO TABS: 7.5000 mg | ORAL_TABLET | Freq: Every day | ORAL | 3 refills | Status: AC

## 2024-11-08 MED ORDER — VILOXAZINE HCL ER 200 MG PO CP24: 200.0000 mg | ORAL_CAPSULE | Freq: Every day | ORAL | 3 refills | Status: AC

## 2024-11-08 NOTE — Progress Notes (Signed)
 BH MD/PA/NP OP Progress Note Virtual Visit via Video Note  I connected with Shannon Jimenez on 11/08/24 at  3:30 PM EST by a video enabled telemedicine application and verified that I am speaking with the correct person using two identifiers.  Location: Patient: Home Provider: Clinic   I discussed the limitations of evaluation and management by telemedicine and the availability of in person appointments. The patient expressed understanding and agreed to proceed.  I provided 30 minutes of non-face-to-face time during this encounter.     11/08/2024 3:18 PM Chabeli Safa Derner  MRN:  [id number]  Chief Complaint: I changed insurance and can now afford Particia    HPI: 24 year old female seen today for follow up psychiatric evaluation. She has a psychiatric history of ADHD, anxiety, and depression.  She is currently managed on mirtazapine  7.5 mg nightly. She also takes over-the-counter melatonin.  She notes her medication are effective in managing her psychiatric conditions.  Today she was well groomed, pleasant, cooperative, engaged in conversation, and maintained eye contact.  She informed clinical research associate that she recently got new insurance and can now afford [place]. At her last visit she discontinued intuniv  as  she found it ineffective. She notes that she continues to have poor concentration, inattentiveness, forgetfulness, and inattentive to mentally taxing task.  Since her last visit she notes that she got her license. She reports that she has been driving more. 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 Patient also notes that she has started a new diet. She reports that she eats 1700 calories a day. She notes that her goal weight is to be around 150 pounds. She denies negative behaviors (binge/purging) with food. She also reports that she has also been mudlogger projects. She notes that she list them on [employer].    Today she reports that he mood is stable  and reports that she has minimal anxiety and depression.  Today provider conducted a GAD-7 and patient scored a 2, at her last visit she scored a 4.  Provider also conducted PHQ-9 and she scored an 8, at his last visit she scored a 5.  She endorses adequate sleep.  Today she denies SI/HI/VAH, mania, paranoia  Today patient agreeable to starting Qulbree 200 mg restarted to help manage her ADHD. She will continue Mirtazapine  as prescribed. Potential side effects of medication and risks vs benefits of treatment vs non-treatment were explained and discussed. All questions were answered.No other concerns at this time        Visit Diagnosis:    ICD-10-CM   1. Major depressive disorder, recurrent episode, moderate (HCC)  F33.1 mirtazapine  (REMERON ) 7.5 MG tablet    2. Generalized anxiety disorder  F41.1 mirtazapine  (REMERON ) 7.5 MG tablet    3. Attention deficit hyperactivity disorder (ADHD), predominantly inattentive type  F90.0 viloxazine ER (QELBREE) 200 MG 24 hr capsule             Past Psychiatric History: anxiety and depression  Past Medical History:  Past Medical History:  Diagnosis Date   ADHD (attention deficit hyperactivity disorder)    Generalized anxiety disorder    Headache    MDD (major depressive disorder)     Past Surgical History:  Procedure Laterality Date   NO PAST SURGERIES      Family Psychiatric History: Maternal Aunt bipolar disorder, depression, and anxiety, maternal mother depression, and anxiety, mother depression and anxiety, sister depression and anxiety   Family History:  Family History  Problem Relation Age of Onset   Migraines Mother    ADD / ADHD Mother    Anxiety disorder Mother    Depression Mother    Depression Maternal Aunt    Migraines Maternal Aunt    Depression Maternal Uncle    Anxiety disorder Maternal Grandmother    Depression Maternal Grandmother    Migraines Paternal Grandmother    Other Paternal Grandfather        Died at  the age of 89 due to Flu/H1N1   Seizures Neg Hx    Autism Neg Hx    Bipolar disorder Neg Hx    Schizophrenia Neg Hx     Social History:  Social History   Socioeconomic History   Marital status: Single    Spouse name: Not on file   Number of children: Not on file   Years of education: Not on file   Highest education level: Not on file  Occupational History   Not on file  Tobacco Use   Smoking status: Never   Smokeless tobacco: Never  Vaping Use   Vaping status: Never Used  Substance and Sexual Activity   Alcohol  use: Never   Drug use: Never   Sexual activity: Never  Other Topics Concern   Not on file  Social History Narrative   Not on file   Social Drivers of Health   Tobacco Use: Low Risk (08/22/2024)   Patient History    Smoking Tobacco Use: Never    Smokeless Tobacco Use: Never    Passive Exposure: Not on file  Financial Resource Strain: Not on file  Food Insecurity: Not on file  Transportation Needs: Not on file  Physical Activity: Not on file  Stress: Not on file  Social Connections: Not on file  Depression (EYV7-0): Medium Risk (08/22/2024)   Depression (PHQ2-9)    PHQ-2 Score: 5  Alcohol  Screen: Not on file  Housing: Not on file  Utilities: Not on file  Health Literacy: Not on file    Allergies:  Allergies  Allergen Reactions   Daytrana [Methylphenidate]     Reaction unknown at age 29   Focalin [Dexmethylphenidate Hcl]    Other     Mahi Mahi causes burning sensation in mouth   Strattera [Atomoxetine] Rash    migraines   Wellbutrin [Bupropion] Rash    Metabolic Disorder Labs: No results found for: HGBA1C, MPG No results found for: PROLACTIN No results found for: CHOL, TRIG, HDL, CHOLHDL, VLDL, LDLCALC No results found for: TSH  Therapeutic Level Labs: No results found for: LITHIUM No results found for: VALPROATE No results found for: CBMZ  Current Medications: Current Outpatient Medications  Medication Sig  Dispense Refill   ibuprofen (ADVIL) 100 MG tablet Take 100 mg by mouth as needed for fever.     MELATONIN PO Take by mouth at bedtime.     mirtazapine  (REMERON ) 7.5 MG tablet Take 1 tablet (7.5 mg total) by mouth at bedtime. 30 tablet 3   viloxazine ER (QELBREE) 200 MG 24 hr capsule Take 1 capsule (200 mg total) by mouth daily. 30 capsule 3   No current facility-administered medications for this visit.     Musculoskeletal: Strength & Muscle Tone: within normal limits and  telehealth visit Gait & Station: normal, telehealth visit Patient leans: N/A  Psychiatric Specialty Exam: Review of Systems  There were no vitals taken for this visit.There is no height or weight on file to calculate BMI.  General Appearance: Well Groomed  Eye Contact:  Good  Speech:  Clear and Coherent and Normal Rate  Volume:  Normal  Mood:  Euthymic  Affect:  Appropriate and Congruent  Thought Process:  Coherent, Goal Directed and Linear  Orientation:  Full (Time, Place, and Person)  Thought Content: WDL and Logical   Suicidal Thoughts:  No  Homicidal Thoughts:  No  Memory:  Immediate;   Good Recent;   Good Remote;   Good  Judgement:  Good  Insight:  Good  Psychomotor Activity:  Normal  Concentration:  Concentration: Good and Attention Span: Good  Recall:  Good  Fund of Knowledge: Good  Language: Good  Akathisia:  No  Handed:  Right  AIMS (if indicated): Not done  Assets:  Communication Skills Desire for Improvement Financial Resources/Insurance Housing Leisure Time Physical Health Social Support  ADL's:  Intact  Cognition: WNL  Sleep:  Good   Screenings: GAD-7    Flowsheet Row Video Visit from 08/22/2024 in [facility] Video Visit from 03/08/2024 in [facility] Video Visit from 12/08/2023 in [facility] Video Visit from 10/11/2023 in [facility] Video Visit from 07/19/2023 in  [facility]  Total GAD-7 Score 4 3 8 7 2    PHQ2-9    Flowsheet Row Video Visit from 08/22/2024 in [facility] Video Visit from 03/08/2024 in [facility] Video Visit from 12/08/2023 in [facility] Video Visit from 10/11/2023 in [facility] Video Visit from 07/19/2023 in [facility]  PHQ-2 Total Score 2 1 5 2  0  PHQ-9 Total Score 5 8 15 5 7    Flowsheet Row Video Visit from 12/08/2023 in [place] Video Visit from 09/23/2022 in [facility] Video Visit from 11/12/2021 in [facility]  C-SSRS RISK CATEGORY Error: Q7 should not be populated when Q6 is No Error: Q7 should not be populated when Q6 is No Error: Q7 should not be populated when Q6 is No     Assessment and Plan: Patient notes that her anxiety and depression are well managed. She continues to have symptom of ADHD. Today patient agreeable to starting Qulbree 200 mg restarted to help manage her ADHD. She will continue Mirtazapine  as prescribed.  1. Major depressive disorder, recurrent episode, moderate (HCC)  Continue- mirtazapine  (REMERON ) 7.5 MG tablet; Take 1 tablet (7.5 mg total) by mouth at bedtime.  Dispense: 30 tablet; Refill: 3  2. Generalized anxiety disorder  Continue- mirtazapine  (REMERON ) 7.5 MG tablet; Take 1 tablet (7.5 mg total) by mouth at bedtime.  Dispense: 30 tablet; Refill: 3  3. Attention deficit hyperactivity disorder (ADHD), predominantly inattentive type  Start- viloxazine ER (QELBREE) 200 MG 24 hr capsule; Take 1 capsule (200 mg total) by mouth daily.  Dispense: 30 capsule; Refill: 3   Follow-up in 2.5 months Follow-up with therapy   Zane FORBES Bach, NP 11/08/2024, 3:18 PM

## 2025-01-09 ENCOUNTER — Telehealth (HOSPITAL_COMMUNITY): Admitting: Psychiatry
